# Patient Record
Sex: Female | Born: 1985
Health system: Southern US, Community
[De-identification: ages and names within clinical notes are randomized; demographics above are authoritative.]

## PROBLEM LIST (undated history)

## (undated) ENCOUNTER — Inpatient Hospital Stay (HOSPITAL_COMMUNITY): Payer: Self-pay

## (undated) DIAGNOSIS — O009 Unspecified ectopic pregnancy without intrauterine pregnancy: Secondary | ICD-10-CM

## (undated) HISTORY — DX: Unspecified ectopic pregnancy without intrauterine pregnancy: O00.90

## (undated) HISTORY — PX: LAPAROTOMY: SHX154

---
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
## 2012-11-15 NOTE — L&D Delivery Note (Signed)
Delivery Note At 7:13 PM a viable female was delivered via Vaginal, Spontaneous Delivery (Presentation: Left Occiput Anterior).  APGAR: 4, 9; weight 9 lb 1.4 oz (4122 g).   Placenta status: Intact, Spontaneous.  Cord: 3 vessels with the following complications: see below.  Cord pH: 7.21  Anesthesia: Epidural  Episiotomy: None Lacerations: Labial Suture Repair: 2.0 vicryl Est. Blood Loss (mL): 400  Mom to postpartum.  Baby to nursery-stable.  Pt pushed from c/c/+3 to deliver via NSVD a term viable female.  Pt with a very mild shoulder dystocia that lasted for <30 seconds and required McRoberts and suprapubic pressure alone to release the anterior shoulder.  Baby presented floppy without initial spontaneous cry. Cord was clamped and cut and he was taken to the warmer where NICU team soon arrived to evaluate him.  He aroused quickly. Cord pH was 7.21. Apgars of 4 and 9.  Placenta delivered spontaneously intact with traction and pitocin.  Small labial tear repaired with 2.0 vicryl with adequate hemostasis.  Baby and Mom did well without further complications.   Luc Shammas L 07/20/2013, 8:59 PM

## 2013-03-30 ENCOUNTER — Ambulatory Visit (INDEPENDENT_AMBULATORY_CARE_PROVIDER_SITE_OTHER): Payer: Self-pay | Admitting: Advanced Practice Midwife

## 2013-03-30 ENCOUNTER — Encounter: Payer: Self-pay | Admitting: Advanced Practice Midwife

## 2013-03-30 VITALS — BP 107/61 | Ht 65.0 in | Wt 160.0 lb

## 2013-03-30 DIAGNOSIS — O0932 Supervision of pregnancy with insufficient antenatal care, second trimester: Secondary | ICD-10-CM

## 2013-03-30 DIAGNOSIS — Z8742 Personal history of other diseases of the female genital tract: Secondary | ICD-10-CM

## 2013-03-30 DIAGNOSIS — O0930 Supervision of pregnancy with insufficient antenatal care, unspecified trimester: Secondary | ICD-10-CM

## 2013-03-30 DIAGNOSIS — Z8759 Personal history of other complications of pregnancy, childbirth and the puerperium: Secondary | ICD-10-CM

## 2013-03-30 LAB — OB RESULTS CONSOLE GC/CHLAMYDIA: Chlamydia: NEGATIVE

## 2013-03-30 NOTE — Progress Notes (Signed)
Last pap in March @ [facility]  p-84

## 2013-03-30 NOTE — Patient Instructions (Addendum)
Pregnancy - Second Trimester The second trimester of pregnancy (3 to 6 months) is a period of rapid growth for you and your baby. At the end of the sixth month, your baby is about 9 inches long and weighs 1 1/2 pounds. You will begin to feel the baby move between 18 and 20 weeks of the pregnancy. This is called quickening. Weight gain is faster. A clear fluid (colostrum) may leak out of your breasts. You may feel small contractions of the womb (uterus). This is known as false labor or Braxton-Hicks contractions. This is like a practice for labor when the baby is ready to be born. Usually, the problems with morning sickness have usually passed by the end of your first trimester. Some women develop small dark blotches (called cholasma, mask of pregnancy) on their face that usually goes away after the baby is born. Exposure to the sun makes the blotches worse. Acne may also develop in some pregnant women and pregnant women who have acne, may find that it goes away. PRENATAL EXAMS  Blood work may continue to be done during prenatal exams. These tests are done to check on your health and the probable health of your baby. Blood work is used to follow your blood levels (hemoglobin). Anemia (low hemoglobin) is common during pregnancy. Iron and vitamins are given to help prevent this. You will also be checked for diabetes between 24 and 28 weeks of the pregnancy. Some of the previous blood tests may be repeated.  The size of the uterus is measured during each visit. This is to make sure that the baby is continuing to grow properly according to the dates of the pregnancy.  Your blood pressure is checked every prenatal visit. This is to make sure you are not getting toxemia.  Your urine is checked to make sure you do not have an infection, diabetes or protein in the urine.  Your weight is checked often to make sure gains are happening at the suggested rate. This is to ensure that both you and your baby are growing  normally.  Sometimes, an ultrasound is performed to confirm the proper growth and development of the baby. This is a test which bounces harmless sound waves off the baby so your caregiver can more accurately determine due dates. Sometimes, a specialized test is done on the amniotic fluid surrounding the baby. This test is called an amniocentesis. The amniotic fluid is obtained by sticking a needle into the belly (abdomen). This is done to check the chromosomes in instances where there is a concern about possible genetic problems with the baby. It is also sometimes done near the end of pregnancy if an early delivery is required. In this case, it is done to help make sure the baby's lungs are mature enough for the baby to live outside of the womb. CHANGES OCCURING IN THE SECOND TRIMESTER OF PREGNANCY Your body goes through many changes during pregnancy. They vary from person to person. Talk to your caregiver about changes you notice that you are concerned about.  During the second trimester, you will likely have an increase in your appetite. It is normal to have cravings for certain foods. This varies from person to person and pregnancy to pregnancy.  Your lower abdomen will begin to bulge.  You may have to urinate more often because the uterus and baby are pressing on your bladder. It is also common to get more bladder infections during pregnancy (pain with urination). You can help this by   drinking lots of fluids and emptying your bladder before and after intercourse.  You may begin to get stretch marks on your hips, abdomen, and breasts. These are normal changes in the body during pregnancy. There are no exercises or medications to take that prevent this change.  You may begin to develop swollen and bulging veins (varicose veins) in your legs. Wearing support hose, elevating your feet for 15 minutes, 3 to 4 times a day and limiting salt in your diet helps lessen the problem.  Heartburn may develop  as the uterus grows and pushes up against the stomach. Antacids recommended by your caregiver helps with this problem. Also, eating smaller meals 4 to 5 times a day helps.  Constipation can be treated with a stool softener or adding bulk to your diet. Drinking lots of fluids, vegetables, fruits, and whole grains are helpful.  Exercising is also helpful. If you have been very active up until your pregnancy, most of these activities can be continued during your pregnancy. If you have been less active, it is helpful to start an exercise program such as walking.  Hemorrhoids (varicose veins in the rectum) may develop at the end of the second trimester. Warm sitz baths and hemorrhoid cream recommended by your caregiver helps hemorrhoid problems.  Backaches may develop during this time of your pregnancy. Avoid heavy lifting, wear low heal shoes and practice good posture to help with backache problems.  Some pregnant women develop tingling and numbness of their hand and fingers because of swelling and tightening of ligaments in the wrist (carpel tunnel syndrome). This goes away after the baby is born.  As your breasts enlarge, you may have to get a bigger bra. Get a comfortable, cotton, support bra. Do not get a nursing bra until the last month of the pregnancy if you will be nursing the baby.  You may get a dark line from your belly button to the pubic area called the linea nigra.  You may develop rosy cheeks because of increase blood flow to the face.  You may develop spider looking lines of the face, neck, arms and chest. These go away after the baby is born. HOME CARE INSTRUCTIONS   It is extremely important to avoid all smoking, herbs, alcohol, and unprescribed drugs during your pregnancy. These chemicals affect the formation and growth of the baby. Avoid these chemicals throughout the pregnancy to ensure the delivery of a healthy infant.  Most of your home care instructions are the same as  suggested for the first trimester of your pregnancy. Keep your caregiver's appointments. Follow your caregiver's instructions regarding medication use, exercise and diet.  During pregnancy, you are providing food for you and your baby. Continue to eat regular, well-balanced meals. Choose foods such as meat, fish, milk and other low fat dairy products, vegetables, fruits, and whole-grain breads and cereals. Your caregiver will tell you of the ideal weight gain.  A physical sexual relationship may be continued up until near the end of pregnancy if there are no other problems. Problems could include early (premature) leaking of amniotic fluid from the membranes, vaginal bleeding, abdominal pain, or other medical or pregnancy problems.  Exercise regularly if there are no restrictions. Check with your caregiver if you are unsure of the safety of some of your exercises. The greatest weight gain will occur in the last 2 trimesters of pregnancy. Exercise will help you:  Control your weight.  Get you in shape for labor and delivery.  Lose weight   after you have the baby.  Wear a good support or jogging bra for breast tenderness during pregnancy. This may help if worn during sleep. Pads or tissues may be used in the bra if you are leaking colostrum.  Do not use hot tubs, steam rooms or saunas throughout the pregnancy.  Wear your seat belt at all times when driving. This protects you and your baby if you are in an accident.  Avoid raw meat, uncooked cheese, cat litter boxes and soil used by cats. These carry germs that can cause birth defects in the baby.  The second trimester is also a good time to visit your dentist for your dental health if this has not been done yet. Getting your teeth cleaned is OK. Use a soft toothbrush. Brush gently during pregnancy.  It is easier to loose urine during pregnancy. Tightening up and strengthening the pelvic muscles will help with this problem. Practice stopping your  urination while you are going to the bathroom. These are the same muscles you need to strengthen. It is also the muscles you would use as if you were trying to stop from passing gas. You can practice tightening these muscles up 10 times a set and repeating this about 3 times per day. Once you know what muscles to tighten up, do not perform these exercises during urination. It is more likely to contribute to an infection by backing up the urine.  Ask for help if you have financial, counseling or nutritional needs during pregnancy. Your caregiver will be able to offer counseling for these needs as well as refer you for other special needs.  Your skin may become oily. If so, wash your face with mild soap, use non-greasy moisturizer and oil or cream based makeup. MEDICATIONS AND DRUG USE IN PREGNANCY  Take prenatal vitamins as directed. The vitamin should contain 1 milligram of folic acid. Keep all vitamins out of reach of children. Only a couple vitamins or tablets containing iron may be fatal to a baby or young child when ingested.  Avoid use of all medications, including herbs, over-the-counter medications, not prescribed or suggested by your caregiver. Only take over-the-counter or prescription medicines for pain, discomfort, or fever as directed by your caregiver. Do not use aspirin.  Let your caregiver also know about herbs you may be using.  Alcohol is related to a number of birth defects. This includes fetal alcohol syndrome. All alcohol, in any form, should be avoided completely. Smoking will cause low birth rate and premature babies.  Street or illegal drugs are very harmful to the baby. They are absolutely forbidden. A baby born to an addicted mother will be addicted at birth. The baby will go through the same withdrawal an adult does. SEEK MEDICAL CARE IF:  You have any concerns or worries during your pregnancy. It is better to call with your questions if you feel they cannot wait, rather  than worry about them. SEEK IMMEDIATE MEDICAL CARE IF:   An unexplained oral temperature above 102 F (38.9 C) develops, or as your caregiver suggests.  You have leaking of fluid from the vagina (birth canal). If leaking membranes are suspected, take your temperature and tell your caregiver of this when you call.  There is vaginal spotting, bleeding, or passing clots. Tell your caregiver of the amount and how many pads are used. Light spotting in pregnancy is common, especially following intercourse.  You develop a bad smelling vaginal discharge with a change in the color from clear   to white.  You continue to feel sick to your stomach (nauseated) and have no relief from remedies suggested. You vomit blood or coffee ground-like materials.  You lose more than 2 pounds of weight or gain more than 2 pounds of weight over 1 week, or as suggested by your caregiver.  You notice swelling of your face, hands, feet, or legs.  You get exposed to German measles and have never had them.  You are exposed to fifth disease or chickenpox.  You develop belly (abdominal) pain. Round ligament discomfort is a common non-cancerous (benign) cause of abdominal pain in pregnancy. Your caregiver still must evaluate you.  You develop a bad headache that does not go away.  You develop fever, diarrhea, pain with urination, or shortness of breath.  You develop visual problems, blurry, or double vision.  You fall or are in a car accident or any kind of trauma.  There is mental or physical violence at home. Document Released: 10/26/2001 Document Revised: 01/24/2012 Document Reviewed: 04/30/2009 Exit[employer] Patient Information 2013 [employer].  

## 2013-03-31 LAB — HIV ANTIBODY (ROUTINE TESTING W REFLEX): HIV: NONREACTIVE

## 2013-03-31 LAB — SICKLE CELL SCREEN: Sickle Cell Screen: NEGATIVE

## 2013-04-01 ENCOUNTER — Encounter: Payer: Self-pay | Admitting: Advanced Practice Midwife

## 2013-04-01 DIAGNOSIS — Z8759 Personal history of other complications of pregnancy, childbirth and the puerperium: Secondary | ICD-10-CM | POA: Insufficient documentation

## 2013-04-01 NOTE — Progress Notes (Signed)
See Smart Set note   Subjective:    Monique Hickman is a G2P0010 [redacted]w[redacted]d being seen today for her first obstetrical visit.  Her obstetrical history is significant for late prenatal care. Patient does intend to breast feed. Pregnancy history fully reviewed.  Patient reports no complaints.  Filed Vitals:   03/30/13 1022 03/30/13 1050  BP: 107/61   Height:  5\' 5"  (1.651 m)  Weight: 160 lb (72.576 kg)     HISTORY: OB History   Grav Para Term Preterm Abortions TAB SAB Ect Mult Living   2 0   1   1  0     # Outc Date GA Lbr Len/2nd Wgt Sex Del Anes PTL Lv   1 ECT 5/03           2 CUR              Past Medical History  Diagnosis Date  . Ectopic pregnancy 2003    Lapt     Past Surgical History  Procedure Laterality Date  . Laparotomy      Removal of tube   History reviewed. No pertinent family history.   Exam    Uterus:  Fundal Height: 24 cm  Pelvic Exam:    Perineum: No Hemorrhoids   Vulva: Deferred today   Vagina:  normal discharge   pH:    Cervix: deferred today   Adnexa: not evaluated   Bony Pelvis: Unproven  System: Breast:  normal appearance, no masses or tenderness   Skin: normal coloration and turgor, no rashes    Neurologic: oriented, grossly non-focal   Extremities: normal strength, tone, and muscle mass   HEENT trachea midline   Mouth/Teeth mucous membranes moist, pharynx normal without lesions   Neck supple and no masses   Cardiovascular: regular rate and rhythm, no murmurs or gallops   Respiratory:  appears well, vitals normal, no respiratory distress, acyanotic, normal RR, ear and throat exam is normal, neck free of mass or lymphadenopathy, chest clear, no wheezing, crepitations, rhonchi, normal symmetric air entry   Abdomen: soft, non-tender; bowel sounds normal; no masses,  no organomegaly   Urinary: urethral meatus normal   Pap deferred today, pt state just had one    Assessment:    Pregnancy: G2P0010 Patient Active Problem List   Diagnosis  Date Noted  . History of ectopic pregnancy 04/01/2013        Plan:     Initial labs drawn. Prenatal vitamins. Problem list reviewed and updated. Genetic Screening discussed First Screen: Too Late.  Ultrasound discussed; fetal survey: ordered.  Follow up in 4 weeks. 50% of 30 min visit spent on counseling and coordination of care.     [facility] 04/01/2013

## 2013-04-02 LAB — OBSTETRIC PANEL
Antibody Screen: NEGATIVE
Basophils Absolute: 0 10*3/uL (ref 0.0–0.1)
Basophils Relative: 0 % (ref 0–1)
Eosinophils Absolute: 0.1 10*3/uL (ref 0.0–0.7)
Eosinophils Relative: 2 % (ref 0–5)
MCH: 30.1 pg (ref 26.0–34.0)
MCHC: 34.4 g/dL (ref 30.0–36.0)
MCV: 87.5 fL (ref 78.0–100.0)
Platelets: 171 10*3/uL (ref 150–400)
RDW: 15.3 % (ref 11.5–15.5)
WBC: 7.9 10*3/uL (ref 4.0–10.5)

## 2013-04-02 LAB — GC/CHLAMYDIA PROBE AMP: GC Probe RNA: NEGATIVE

## 2013-04-12 ENCOUNTER — Ambulatory Visit (HOSPITAL_COMMUNITY)
Admission: RE | Admit: 2013-04-12 | Discharge: 2013-04-12 | Disposition: A | Discharge status: Home / Self Care | Payer: Medicaid Other | Source: Ambulatory Visit | Attending: Advanced Practice Midwife | Admitting: Advanced Practice Midwife

## 2013-04-12 DIAGNOSIS — O0930 Supervision of pregnancy with insufficient antenatal care, unspecified trimester: Secondary | ICD-10-CM | POA: Insufficient documentation

## 2013-04-12 DIAGNOSIS — O0932 Supervision of pregnancy with insufficient antenatal care, second trimester: Secondary | ICD-10-CM

## 2013-04-12 DIAGNOSIS — Z3689 Encounter for other specified antenatal screening: Secondary | ICD-10-CM | POA: Insufficient documentation

## 2013-04-20 ENCOUNTER — Ambulatory Visit (INDEPENDENT_AMBULATORY_CARE_PROVIDER_SITE_OTHER): Payer: Medicaid Other | Admitting: Advanced Practice Midwife

## 2013-04-20 ENCOUNTER — Encounter: Payer: Self-pay | Admitting: Advanced Practice Midwife

## 2013-04-20 VITALS — BP 114/63 | Wt 165.0 lb

## 2013-04-20 DIAGNOSIS — Z3482 Encounter for supervision of other normal pregnancy, second trimester: Secondary | ICD-10-CM

## 2013-04-20 DIAGNOSIS — Z3480 Encounter for supervision of other normal pregnancy, unspecified trimester: Secondary | ICD-10-CM

## 2013-04-20 LAB — CBC
Hemoglobin: 11.9 g/dL — ABNORMAL LOW (ref 12.0–15.0)
MCH: 29.6 pg (ref 26.0–34.0)
Platelets: 165 10*3/uL (ref 150–400)
RBC: 4.02 MIL/uL (ref 3.87–5.11)
WBC: 8.3 10*3/uL (ref 4.0–10.5)

## 2013-04-20 NOTE — Progress Notes (Signed)
Feels well. Occasional back pain. No contractions. Glucola today

## 2013-04-20 NOTE — Progress Notes (Signed)
28 week labs  p-92

## 2013-04-20 NOTE — Patient Instructions (Signed)

## 2013-04-21 LAB — RPR

## 2013-04-21 LAB — GLUCOSE TOLERANCE, 1 HOUR (50G) W/O FASTING: Glucose, 1 Hour GTT: 104 mg/dL (ref 70–140)

## 2013-04-21 LAB — HIV ANTIBODY (ROUTINE TESTING W REFLEX): HIV: NONREACTIVE

## 2013-05-07 ENCOUNTER — Encounter: Payer: Self-pay | Admitting: Advanced Practice Midwife

## 2013-05-07 ENCOUNTER — Ambulatory Visit (INDEPENDENT_AMBULATORY_CARE_PROVIDER_SITE_OTHER): Payer: Medicaid Other | Admitting: Advanced Practice Midwife

## 2013-05-07 VITALS — BP 112/63 | Wt 167.0 lb

## 2013-05-07 DIAGNOSIS — O0930 Supervision of pregnancy with insufficient antenatal care, unspecified trimester: Secondary | ICD-10-CM | POA: Insufficient documentation

## 2013-05-07 DIAGNOSIS — O0932 Supervision of pregnancy with insufficient antenatal care, second trimester: Secondary | ICD-10-CM

## 2013-05-07 NOTE — Patient Instructions (Addendum)

## 2013-05-07 NOTE — Progress Notes (Signed)
Doing well. Has some round ligament pains and some nagging pain on right side (has been coughing this week). Discussed probably loosened cartilage between ribs. No further respiratory symptoms. Informed glucola WNL

## 2013-05-07 NOTE — Progress Notes (Signed)
p-85 

## 2013-05-22 ENCOUNTER — Encounter: Payer: Medicaid Other | Admitting: Obstetrics & Gynecology

## 2013-05-28 ENCOUNTER — Ambulatory Visit (INDEPENDENT_AMBULATORY_CARE_PROVIDER_SITE_OTHER): Payer: Medicaid Other | Admitting: Advanced Practice Midwife

## 2013-05-28 VITALS — BP 115/58 | Wt 171.0 lb

## 2013-05-28 DIAGNOSIS — O0930 Supervision of pregnancy with insufficient antenatal care, unspecified trimester: Secondary | ICD-10-CM

## 2013-05-28 NOTE — Progress Notes (Signed)
Doing well.  Good fetal movement, denies vaginal bleeding, LOF, regular contractions.  PTL precautions given.  Discussed rest, heat, increased PO fluids, position changes for round ligament pain.

## 2013-05-28 NOTE — Progress Notes (Signed)
P = 91 

## 2013-06-06 ENCOUNTER — Encounter (HOSPITAL_COMMUNITY): Payer: Self-pay | Admitting: *Deleted

## 2013-06-06 ENCOUNTER — Inpatient Hospital Stay (HOSPITAL_COMMUNITY)
Admission: AD | Admit: 2013-06-06 | Discharge: 2013-06-06 | Disposition: A | Discharge status: Home / Self Care | Payer: Medicaid Other | Source: Ambulatory Visit | Attending: Obstetrics & Gynecology | Admitting: Obstetrics & Gynecology

## 2013-06-06 ENCOUNTER — Telehealth: Payer: Self-pay | Admitting: *Deleted

## 2013-06-06 DIAGNOSIS — N949 Unspecified condition associated with female genital organs and menstrual cycle: Secondary | ICD-10-CM

## 2013-06-06 DIAGNOSIS — R1032 Left lower quadrant pain: Secondary | ICD-10-CM | POA: Insufficient documentation

## 2013-06-06 DIAGNOSIS — O99891 Other specified diseases and conditions complicating pregnancy: Secondary | ICD-10-CM | POA: Insufficient documentation

## 2013-06-06 DIAGNOSIS — IMO0001 Reserved for inherently not codable concepts without codable children: Secondary | ICD-10-CM | POA: Insufficient documentation

## 2013-06-06 DIAGNOSIS — R1084 Generalized abdominal pain: Secondary | ICD-10-CM

## 2013-06-06 DIAGNOSIS — M545 Low back pain: Secondary | ICD-10-CM

## 2013-06-06 DIAGNOSIS — O479 False labor, unspecified: Secondary | ICD-10-CM

## 2013-06-06 DIAGNOSIS — M7918 Myalgia, other site: Secondary | ICD-10-CM

## 2013-06-06 LAB — URINALYSIS, ROUTINE W REFLEX MICROSCOPIC
Ketones, ur: NEGATIVE mg/dL
Leukocytes, UA: NEGATIVE
Nitrite: NEGATIVE
Protein, ur: NEGATIVE mg/dL
Urobilinogen, UA: 0.2 mg/dL (ref 0.0–1.0)

## 2013-06-06 MED ORDER — CYCLOBENZAPRINE HCL 10 MG PO TABS: 10.0000 mg | ORAL_TABLET | Freq: Three times a day (TID) | ORAL | Status: DC | PRN

## 2013-06-06 NOTE — MAU Note (Signed)
Patient states she started having pain that starts on the left side and radiates all over the low back to the right side. Some abdominal pain off an on. Denies bleeding or leaking. Reports good fetal movement.

## 2013-06-06 NOTE — MAU Provider Note (Signed)
Chief Complaint:  Back Pain   None     HPI: Monique Hickman is a 27 y.o. G2P0010 pt of [facility] at [redacted]w[redacted]d who presents to maternity admissions reporting pain in lower left abdomen radiating to her back.  The pain is intermittent and severe enough she left work early today.  She reports good fetal movement, denies LOF, vaginal bleeding, vaginal itching/burning, urinary symptoms, h/a, dizziness, n/v, or fever/chills.     Past Medical History: Past Medical History  Diagnosis Date  . Ectopic pregnancy 2003    Lapt      Past obstetric history: OB History   Grav Para Term Preterm Abortions TAB SAB Ect Mult Living   2 0   1   1  0     # Outc Date GA Lbr Len/2nd Wgt Sex Del Anes PTL Lv   1 ECT 5/03           2 CUR               Past Surgical History: Past Surgical History  Procedure Laterality Date  . Laparotomy      Removal of tube    Family History: History reviewed. No pertinent family history.  Social History: History  Substance Use Topics  . Smoking status: Never Smoker   . Smokeless tobacco: Never Used  . Alcohol Use: No    Allergies: No Known Allergies  Meds:  Prescriptions prior to admission  Medication Sig Dispense Refill  . Prenatal Vit-Fe Fumarate-FA (PRENATAL MULTIVITAMIN) TABS Take 1 tablet by mouth daily at 12 noon.        ROS: Pertinent findings in history of present illness.  Physical Exam  Blood pressure 115/74, pulse 99, temperature 97.9 F (36.6 C), temperature source Oral, resp. rate 16, height 5\' 5"  (1.651 m), weight 77.928 kg (171 lb 12.8 oz), last menstrual period 10/11/2012, SpO2 100.00%. GENERAL: Well-developed, well-nourished female in no acute distress.  HEENT: normocephalic HEART: normal rate RESP: normal effort ABDOMEN: Soft, non-tender, gravid appropriate for gestational age Negative CVA tenderness, tenderness to palpation in left inguinal area and left lower back EXTREMITIES: Nontender, no edema NEURO: alert and  oriented  Dilation: 1 Effacement (%): Thick Cervical Position: Anterior Presentation: Undeterminable Exam by:: Ginger Morris RN  FHT:  Baseline 145 , moderate variability, accelerations present, no decelerations Contractions: q 10 min, irregular, mild to palpation   Labs: Results for orders placed during the hospital encounter of 06/06/13 (from the past 24 hour(s))  URINALYSIS, ROUTINE W REFLEX MICROSCOPIC     Status: None   Collection Time    06/06/13  4:46 PM      Result Value Range   Color, Urine YELLOW  YELLOW   APPearance CLEAR  CLEAR   Specific Gravity, Urine 1.015  1.005 - 1.030   pH 7.5  5.0 - 8.0   Glucose, UA NEGATIVE  NEGATIVE mg/dL   Hgb urine dipstick NEGATIVE  NEGATIVE   Bilirubin Urine NEGATIVE  NEGATIVE   Ketones, ur NEGATIVE  NEGATIVE mg/dL   Protein, ur NEGATIVE  NEGATIVE mg/dL   Urobilinogen, UA 0.2  0.0 - 1.0 mg/dL   Nitrite NEGATIVE  NEGATIVE   Leukocytes, UA NEGATIVE  NEGATIVE   Assessment: 1. Round ligament pain   2. Musculoskeletal pain     Plan: Discharge home PTL precautions and fetal kick counts Flexeril 10 mg PO TID PRN Rest, ice, heat, warm bath, Tylenol for pain Recommend pregnancy support belt for pt F/U at [facility] Return to [place] as  needed  Follow-up Information   Follow up with [facility] [place]. (Keep scheduled appointments.  Return to MAU as needed.)    Contact information:   [address], [address] [place] [address] [phone]       Medication List         cyclobenzaprine 10 MG tablet  Commonly known as:  FLEXERIL  Take 1 tablet (10 mg total) by mouth 3 (three) times daily as needed for muscle spasms.     prenatal multivitamin Tabs  Take 1 tablet by mouth daily at 12 noon.        Sharen Counter Certified Nurse-Midwife 06/06/2013 7:12 PM

## 2013-06-06 NOTE — Telephone Encounter (Signed)
Pt called in adv having 8/10 pain in left side of abd. Pt describes pain as "dragging" and radiating all the way around. I adv pt to report to MAU for eval

## 2013-06-11 ENCOUNTER — Ambulatory Visit (INDEPENDENT_AMBULATORY_CARE_PROVIDER_SITE_OTHER): Payer: Medicaid Other | Admitting: Advanced Practice Midwife

## 2013-06-11 ENCOUNTER — Encounter: Payer: Self-pay | Admitting: Advanced Practice Midwife

## 2013-06-11 VITALS — BP 116/76 | Wt 173.0 lb

## 2013-06-11 DIAGNOSIS — O0932 Supervision of pregnancy with insufficient antenatal care, second trimester: Secondary | ICD-10-CM

## 2013-06-11 DIAGNOSIS — O0930 Supervision of pregnancy with insufficient antenatal care, unspecified trimester: Secondary | ICD-10-CM

## 2013-06-11 NOTE — Progress Notes (Signed)
Doing well. Back feels much better.

## 2013-06-11 NOTE — Progress Notes (Signed)
p-109 

## 2013-06-11 NOTE — Patient Instructions (Signed)

## 2013-06-25 ENCOUNTER — Ambulatory Visit (INDEPENDENT_AMBULATORY_CARE_PROVIDER_SITE_OTHER): Payer: Medicaid Other | Admitting: Advanced Practice Midwife

## 2013-06-25 VITALS — BP 127/73 | Wt 174.0 lb

## 2013-06-25 DIAGNOSIS — Z3403 Encounter for supervision of normal first pregnancy, third trimester: Secondary | ICD-10-CM

## 2013-06-25 DIAGNOSIS — Z3400 Encounter for supervision of normal first pregnancy, unspecified trimester: Secondary | ICD-10-CM

## 2013-06-25 NOTE — Progress Notes (Signed)
Doing well.  Good fetal movement, denies vaginal bleeding, LOF, regular contractions.  Reviewed signs of labor, reasons to return to hospital.

## 2013-06-25 NOTE — Progress Notes (Signed)
p-87 

## 2013-07-02 ENCOUNTER — Ambulatory Visit (INDEPENDENT_AMBULATORY_CARE_PROVIDER_SITE_OTHER): Payer: Medicaid Other | Admitting: Advanced Practice Midwife

## 2013-07-02 VITALS — BP 127/77 | Wt 177.0 lb

## 2013-07-02 DIAGNOSIS — O0930 Supervision of pregnancy with insufficient antenatal care, unspecified trimester: Secondary | ICD-10-CM

## 2013-07-02 DIAGNOSIS — O0933 Supervision of pregnancy with insufficient antenatal care, third trimester: Secondary | ICD-10-CM

## 2013-07-02 NOTE — Patient Instructions (Signed)
Website with pregnancy info: [email or web address]

## 2013-07-02 NOTE — Progress Notes (Signed)
Doing well.  Good fetal movement, denies vaginal bleeding, LOF, regular contractions. Pt fearful of labor.  Discussed normal labor, encouraged pt to try to relax, visualize normal labor.  Pt does not have a ride to hospital.  Encouraged her to make a plan for who will bring her if she is at home, or at work. Reviewed signs of labor/reasons to come to hospital.

## 2013-07-02 NOTE — Progress Notes (Signed)
P = 107 

## 2013-07-09 ENCOUNTER — Ambulatory Visit (INDEPENDENT_AMBULATORY_CARE_PROVIDER_SITE_OTHER): Payer: Medicaid Other | Admitting: Advanced Practice Midwife

## 2013-07-09 VITALS — BP 114/65 | Wt 177.0 lb

## 2013-07-09 DIAGNOSIS — O0930 Supervision of pregnancy with insufficient antenatal care, unspecified trimester: Secondary | ICD-10-CM

## 2013-07-09 NOTE — Progress Notes (Signed)
P- 101-Pt has a lot of leg pain during the night

## 2013-07-09 NOTE — Progress Notes (Signed)
Doing well.  Good fetal movement, denies vaginal bleeding, LOF, regular contractions.  Desires cervical exam. Reviewed signs of labor.  

## 2013-07-17 ENCOUNTER — Encounter: Payer: Medicaid Other | Admitting: Obstetrics & Gynecology

## 2013-07-18 ENCOUNTER — Encounter (HOSPITAL_COMMUNITY): Payer: Self-pay | Admitting: *Deleted

## 2013-07-18 ENCOUNTER — Telehealth (HOSPITAL_COMMUNITY): Payer: Self-pay | Admitting: *Deleted

## 2013-07-18 ENCOUNTER — Ambulatory Visit (INDEPENDENT_AMBULATORY_CARE_PROVIDER_SITE_OTHER): Payer: Medicaid Other | Admitting: Obstetrics & Gynecology

## 2013-07-18 VITALS — BP 118/75 | Wt 178.0 lb

## 2013-07-18 DIAGNOSIS — Z23 Encounter for immunization: Secondary | ICD-10-CM

## 2013-07-18 DIAGNOSIS — O0933 Supervision of pregnancy with insufficient antenatal care, third trimester: Secondary | ICD-10-CM

## 2013-07-18 DIAGNOSIS — O0930 Supervision of pregnancy with insufficient antenatal care, unspecified trimester: Secondary | ICD-10-CM

## 2013-07-18 MED ORDER — TETANUS-DIPHTH-ACELL PERTUSSIS 5-2.5-18.5 LF-MCG/0.5 IM SUSP
0.5000 mL | Freq: Once | INTRAMUSCULAR | Status: AC
  Administered 2013-07-18: 0.5 mL via INTRAMUSCULAR

## 2013-07-18 NOTE — Progress Notes (Signed)
NST today and Monday and induce at 41 weeks.  Tdap offered today.   NO issues.  GBS negative.

## 2013-07-18 NOTE — Progress Notes (Signed)
P = 91 

## 2013-07-18 NOTE — Telephone Encounter (Signed)
Preadmission screen  

## 2013-07-18 NOTE — Addendum Note (Signed)
Addended by: Arne Cleveland on: 07/18/2013 10:55 AM   Modules accepted: Orders

## 2013-07-20 ENCOUNTER — Inpatient Hospital Stay (HOSPITAL_COMMUNITY)
Admission: AD | Admit: 2013-07-20 | Discharge: 2013-07-22 | DRG: 775 | Disposition: A | Discharge status: Home / Self Care | Payer: Medicaid Other | Source: Ambulatory Visit | Attending: Obstetrics & Gynecology | Admitting: Obstetrics & Gynecology

## 2013-07-20 ENCOUNTER — Inpatient Hospital Stay (HOSPITAL_COMMUNITY): Payer: Medicaid Other | Admitting: Anesthesiology

## 2013-07-20 ENCOUNTER — Encounter (HOSPITAL_COMMUNITY): Payer: Self-pay | Admitting: *Deleted

## 2013-07-20 ENCOUNTER — Encounter (HOSPITAL_COMMUNITY): Payer: Self-pay | Admitting: Anesthesiology

## 2013-07-20 DIAGNOSIS — O4290 Premature rupture of membranes, unspecified as to length of time between rupture and onset of labor, unspecified weeks of gestation: Secondary | ICD-10-CM | POA: Diagnosis present

## 2013-07-20 DIAGNOSIS — Z8759 Personal history of other complications of pregnancy, childbirth and the puerperium: Secondary | ICD-10-CM

## 2013-07-20 LAB — CBC
HCT: 37.2 % (ref 36.0–46.0)
Hemoglobin: 12.9 g/dL (ref 12.0–15.0)
MCH: 30.1 pg (ref 26.0–34.0)
MCV: 87 fL (ref 78.0–100.0)
Platelets: 138 10*3/uL — ABNORMAL LOW (ref 150–400)
RDW: 13.5 % (ref 11.5–15.5)
WBC: 15.1 10*3/uL — ABNORMAL HIGH (ref 4.0–10.5)

## 2013-07-20 MED ORDER — PHENYLEPHRINE 40 MCG/ML (10ML) SYRINGE FOR IV PUSH (FOR BLOOD PRESSURE SUPPORT)
80.0000 ug | PREFILLED_SYRINGE | INTRAVENOUS | Status: DC | PRN
  Filled 2013-07-20: qty 5
  Filled 2013-07-20: qty 2

## 2013-07-20 MED ORDER — OXYTOCIN BOLUS FROM INFUSION
500.0000 mL | INTRAVENOUS | Status: DC
  Administered 2013-07-20: 500 mL via INTRAVENOUS

## 2013-07-20 MED ORDER — LIDOCAINE HCL (PF) 1 % IJ SOLN
30.0000 mL | INTRAMUSCULAR | Status: DC | PRN
  Filled 2013-07-20 (×2): qty 30

## 2013-07-20 MED ORDER — PHENYLEPHRINE 40 MCG/ML (10ML) SYRINGE FOR IV PUSH (FOR BLOOD PRESSURE SUPPORT)
80.0000 ug | PREFILLED_SYRINGE | INTRAVENOUS | Status: DC | PRN
  Filled 2013-07-20: qty 2

## 2013-07-20 MED ORDER — LACTATED RINGERS IV SOLN
500.0000 mL | INTRAVENOUS | Status: DC | PRN
  Administered 2013-07-20: 1000 mL via INTRAVENOUS

## 2013-07-20 MED ORDER — FENTANYL 2.5 MCG/ML BUPIVACAINE 1/10 % EPIDURAL INFUSION (WH - ANES)
INTRAMUSCULAR | Status: DC | PRN
  Administered 2013-07-20: 14 mL/h via EPIDURAL

## 2013-07-20 MED ORDER — MISOPROSTOL 50MCG HALF TABLET
50.0000 ug | ORAL_TABLET | ORAL | Status: DC
  Administered 2013-07-20: 50 ug via ORAL
  Filled 2013-07-20 (×9): qty 1

## 2013-07-20 MED ORDER — FENTANYL CITRATE 0.05 MG/ML IJ SOLN
50.0000 ug | INTRAMUSCULAR | Status: DC | PRN
  Administered 2013-07-20 (×3): 50 ug via INTRAVENOUS
  Filled 2013-07-20 (×3): qty 2

## 2013-07-20 MED ORDER — LACTATED RINGERS IV SOLN: 500.0000 mL | Freq: Once | INTRAVENOUS | Status: DC

## 2013-07-20 MED ORDER — FENTANYL 2.5 MCG/ML BUPIVACAINE 1/10 % EPIDURAL INFUSION (WH - ANES)
14.0000 mL/h | INTRAMUSCULAR | Status: DC | PRN
  Administered 2013-07-20: 14 mL/h via EPIDURAL
  Filled 2013-07-20 (×2): qty 125

## 2013-07-20 MED ORDER — LIDOCAINE HCL (PF) 1 % IJ SOLN
INTRAMUSCULAR | Status: DC | PRN
  Administered 2013-07-20 (×2): 4 mL

## 2013-07-20 MED ORDER — DIPHENHYDRAMINE HCL 50 MG/ML IJ SOLN: 12.5000 mg | INTRAMUSCULAR | Status: DC | PRN

## 2013-07-20 MED ORDER — EPHEDRINE 5 MG/ML INJ
10.0000 mg | INTRAVENOUS | Status: DC | PRN
  Filled 2013-07-20: qty 2

## 2013-07-20 MED ORDER — OXYTOCIN 40 UNITS IN LACTATED RINGERS INFUSION - SIMPLE MED
62.5000 mL/h | INTRAVENOUS | Status: DC
  Filled 2013-07-20: qty 1000

## 2013-07-20 MED ORDER — ACETAMINOPHEN 325 MG PO TABS: 650.0000 mg | ORAL_TABLET | ORAL | Status: DC | PRN

## 2013-07-20 MED ORDER — LACTATED RINGERS IV SOLN
INTRAVENOUS | Status: DC
  Administered 2013-07-20 (×2): via INTRAVENOUS

## 2013-07-20 MED ORDER — CITRIC ACID-SODIUM CITRATE 334-500 MG/5ML PO SOLN: 30.0000 mL | ORAL | Status: DC | PRN

## 2013-07-20 MED ORDER — FLEET ENEMA 7-19 GM/118ML RE ENEM: 1.0000 | ENEMA | RECTAL | Status: DC | PRN

## 2013-07-20 MED ORDER — OXYCODONE-ACETAMINOPHEN 5-325 MG PO TABS: 1.0000 | ORAL_TABLET | ORAL | Status: DC | PRN

## 2013-07-20 MED ORDER — EPHEDRINE 5 MG/ML INJ
10.0000 mg | INTRAVENOUS | Status: DC | PRN
  Filled 2013-07-20: qty 4
  Filled 2013-07-20: qty 2

## 2013-07-20 MED ORDER — ONDANSETRON HCL 4 MG/2ML IJ SOLN
4.0000 mg | Freq: Four times a day (QID) | INTRAMUSCULAR | Status: DC | PRN
  Administered 2013-07-20: 4 mg via INTRAVENOUS
  Filled 2013-07-20: qty 2

## 2013-07-20 MED ORDER — IBUPROFEN 600 MG PO TABS
600.0000 mg | ORAL_TABLET | Freq: Four times a day (QID) | ORAL | Status: DC | PRN
  Filled 2013-07-20: qty 1

## 2013-07-20 NOTE — Anesthesia Procedure Notes (Signed)
Epidural Patient location during procedure: OB Start time: 07/20/2013 10:36 AM  Staffing Anesthesiologist: Clarise Chacko A. Performed by: anesthesiologist   Preanesthetic Checklist Completed: patient identified, site marked, surgical consent, pre-op evaluation, timeout performed, IV checked, risks and benefits discussed and monitors and equipment checked  Epidural Patient position: sitting Prep: site prepped and draped and DuraPrep Patient monitoring: continuous pulse ox and blood pressure Approach: midline Injection technique: LOR air  Needle:  Needle type: Tuohy  Needle gauge: 17 G Needle length: 9 cm and 9 Needle insertion depth: 5 cm cm Catheter type: closed end flexible Catheter size: 19 Gauge Catheter at skin depth: 10 cm Test dose: negative and Other  Assessment Events: blood not aspirated, injection not painful, no injection resistance, negative IV test and no paresthesia  Additional Notes Patient identified. Risks and benefits discussed including failed block, incomplete  Pain control, post dural puncture headache, nerve damage, paralysis, blood pressure Changes, nausea, vomiting, reactions to medications-both toxic and allergic and post Partum back pain. All questions were answered. Patient expressed understanding and wished to proceed. Sterile technique was used throughout procedure. Epidural site was Dressed with sterile barrier dressing. No paresthesias, signs of intravascular injection Or signs of intrathecal spread were encountered.  Patient was more comfortable after the epidural was dosed. Please see RN's note for documentation of vital signs and FHR which are stable.

## 2013-07-20 NOTE — MAU Note (Signed)
PT SAYS SROM AT 0100-  CLEAR FLUID.   GETS PNC AT [place]-.       VE IN   WED 1 CM.    DENIES HSV AND MRSA.

## 2013-07-20 NOTE — Progress Notes (Signed)
Monique Hickman is a 27 y.o. G2P0010 at [redacted]w[redacted]d  admitted for PROM  Subjective:  Pt doing well. Comfortable with epidural. +FM  Objective: BP 127/59  Pulse 94  Temp(Src) 98.6 F (37 C) (Oral)  Resp 18  Ht 5\' 5"  (1.651 m)  Wt 80.74 kg (178 lb)  BMI 29.62 kg/m2  SpO2 100%  LMP 10/11/2012      FHT:  FHR: 140 bpm, variability: moderate,  accelerations:  Present,  decelerations:  Absent UC:   regular, every 5-6 minutes SVE:   Dilation: 2 Effacement (%): 90 Station: -2 Exam by:: dherr rn  Labs: Lab Results  Component Value Date   WBC 7.1 07/20/2013   HGB 13.4 07/20/2013   HCT 38.7 07/20/2013   MCV 87.0 07/20/2013   PLT 138* 07/20/2013    Assessment / Plan: PROM s/p cytotec. now progressing on her own  Labor: Progressing normally Fetal Wellbeing:  Category I Pain Control:  Epidural I/D:  n/a Anticipated MOD:  NSVD  Zayvier Caravello L 07/20/2013, 12:57 PM

## 2013-07-20 NOTE — Progress Notes (Signed)
MD notified that RN is waiting for repeat CBC and plastic cap came of epidural tubing. MD states to wrap tubing end in alcohol wipe.

## 2013-07-20 NOTE — H&P (Signed)
Monique Hickman is a 27 y.o. female presenting for SROM. This morning around 1am, she noticed multiple episodes gushing clear fluid. She has not been feeling contractions. She reports good fetal movement.  Maternal Medical History:  Reason for admission: Nausea.    OB History   Grav Para Term Preterm Abortions TAB SAB Ect Mult Living   2 0   1   1  0     Past Medical History  Diagnosis Date  . Ectopic pregnancy 2003    Lapt     Past Surgical History  Procedure Laterality Date  . Laparotomy      Removal of tube   Family History: family history is not on file. Social History:  reports that she has never smoked. She has never used smokeless tobacco. She reports that she does not drink alcohol or use illicit drugs.   Prenatal Transfer Tool  Maternal Diabetes: No Genetic Screening: Too late to [facility] Maternal Ultrasounds/Referrals: Normal Fetal Ultrasounds or other Referrals:  None Maternal Substance Abuse:  No Significant Maternal Medications:  None Significant Maternal Lab Results:  None Other Comments:  Late prenatal care  Review of Systems  Constitutional: Negative for fever and chills.  Eyes: Negative for blurred vision.  Gastrointestinal: Negative for nausea and vomiting.  Genitourinary: Negative for dysuria.  Neurological: Negative for seizures and headaches.   Cervical exam 07/18/2013):  1/50/-3  FHT: 135bpm, moderate varibaility, no accels, 1 late decel and 1 early decel; Category II tracing UC: irregular every 5-6 minutes   Blood pressure 128/65, pulse 103, temperature 99.1 F (37.3 C), temperature source Oral, resp. rate 18, height 5\' 5"  (1.651 m), weight 80.74 kg (178 lb), last menstrual period 10/11/2012, SpO2 100.00%. Exam Physical Exam  Constitutional: She is oriented to person, place, and time. She appears well-developed. No distress.  Cardiovascular: Regular rhythm, S1 normal, S2 normal and intact distal pulses.  Tachycardia present.   No murmur  heard. Respiratory: Effort normal and breath sounds normal. No respiratory distress. She has no wheezes.  GI: There is no tenderness. There is no rebound and no guarding.  Musculoskeletal: Normal range of motion. She exhibits no edema and no tenderness.  Neurological: She is alert and oriented to person, place, and time. She has normal reflexes.  Skin: Skin is warm and dry. She is not diaphoretic.    Prenatal labs: ABO, Rh: O/POS/-- (05/16 1059) Antibody: NEG (05/16 1059) Rubella: 11.80 (05/16 1059) RPR: NON REAC (06/06 1003)  HBsAg: NEGATIVE (05/16 1059)  HIV: NON REACTIVE (06/06 1003)  GBS: Negative (08/11 0000)  1 hour glucose: 104 (Negative)  Assessment/Plan: 27yo G2P0010 at [redacted]w[redacted]d by LMP confirmed by 26w ultrasound, admitted for SROM, currently not feeling contractions  #SROM - admit to birthing suite - admitting attending physician is Dr. Debroah Loop - routine labor orders - pain management - expectant management  #Postpartum - breast/bottlefeed - circumcision outpatient - undecided method of contraception   [name], MD 07/20/2013, 3:38 AM    I have seen and examined this patient and agree the above assessment. CRESENZO-DISHMAN,Ramesses Crampton 07/20/2013 4:20 AM

## 2013-07-20 NOTE — Consult Note (Signed)
Neonatology Note:  Attendance at Code Apgar:   Our team responded to a Code Apgar call to room # 166 following NSVD, due to infant with apnea. The requesting physician was Dr. Reola Calkins. The mother is a G2P0A1 O pos, GBS neg who received PNC in [place], coming to the [place] shortly prior to delivery. The delivery was complicated by shoulder dystocia. ROM occurred 18 hours PTD and the fluid was clear.  At delivery, the baby was floppy, blue, and apneic. The OB nursing staff in attendance gave vigorous stimulation and a Code Apgar was called. Our team arrived just after 1 minute of life, at which time the baby was starting to cry, HR god, tone slightly decreased. We did bulb suctioning and the baby was quite vigorous by 1.5 minutes. Ap 4/9. The baby appears well, without resp distress, and with no focal deficits noted. I spoke with the mother in the DR, then transferred the baby to the Pediatrician's care.   Doretha Sou, MD

## 2013-07-20 NOTE — MAU Note (Signed)
Pt reports ROM at 0100, some contractions.  

## 2013-07-20 NOTE — Progress Notes (Signed)
   Krisann Mckenna is a 27 y.o. G2P0010 at [redacted]w[redacted]d  admitted for PROM  Subjective: Feeling a little crampy   Objective: BP 128/65  Pulse 103  Temp(Src) 98.9 F (37.2 C) (Oral)  Resp 18  Ht 5\' 5"  (1.651 m)  Wt 80.74 kg (178 lb)  BMI 29.62 kg/m2  SpO2 100%  LMP 10/11/2012    FHT:  FHR: 140 bpm, variability: moderate,  accelerations:  Present,  decelerations:  Absent UC:   irregular, every 2-5 minutes SVE:   Dilation: 1 Effacement (%): Thick Station: -3 Exam by:: fran cnm  Labs: Lab Results  Component Value Date   WBC 7.1 07/20/2013   HGB 13.4 07/20/2013   HCT 38.7 07/20/2013   MCV 87.0 07/20/2013   PLT 138* 07/20/2013    Assessment / Plan: PROM at term DIscussed IOL vs expectant management.  Chooses cytotec Labor: no Fetal Wellbeing:  Category I Pain Control:  Labor support without medications Anticipated MOD:  NSVD  CRESENZO-DISHMAN,Kaylob Wallen 07/20/2013, 4:20 AM

## 2013-07-20 NOTE — Anesthesia Preprocedure Evaluation (Signed)

## 2013-07-21 ENCOUNTER — Encounter (HOSPITAL_COMMUNITY): Payer: Self-pay | Admitting: *Deleted

## 2013-07-21 MED ORDER — PRENATAL MULTIVITAMIN CH
1.0000 | ORAL_TABLET | Freq: Every day | ORAL | Status: DC
  Administered 2013-07-21 – 2013-07-22 (×2): 1 via ORAL
  Filled 2013-07-21: qty 1

## 2013-07-21 MED ORDER — SIMETHICONE 80 MG PO CHEW: 80.0000 mg | CHEWABLE_TABLET | ORAL | Status: DC | PRN

## 2013-07-21 MED ORDER — LANOLIN HYDROUS EX OINT: TOPICAL_OINTMENT | CUTANEOUS | Status: DC | PRN

## 2013-07-21 MED ORDER — DIPHENHYDRAMINE HCL 25 MG PO CAPS: 25.0000 mg | ORAL_CAPSULE | Freq: Four times a day (QID) | ORAL | Status: DC | PRN

## 2013-07-21 MED ORDER — ZOLPIDEM TARTRATE 5 MG PO TABS: 5.0000 mg | ORAL_TABLET | Freq: Every evening | ORAL | Status: DC | PRN

## 2013-07-21 MED ORDER — DIBUCAINE 1 % RE OINT: 1.0000 "application " | TOPICAL_OINTMENT | RECTAL | Status: DC | PRN

## 2013-07-21 MED ORDER — ONDANSETRON HCL 4 MG/2ML IJ SOLN: 4.0000 mg | INTRAMUSCULAR | Status: DC | PRN

## 2013-07-21 MED ORDER — MEASLES, MUMPS & RUBELLA VAC ~~LOC~~ INJ
0.5000 mL | INJECTION | Freq: Once | SUBCUTANEOUS | Status: DC
  Filled 2013-07-21: qty 0.5

## 2013-07-21 MED ORDER — IBUPROFEN 600 MG PO TABS
600.0000 mg | ORAL_TABLET | Freq: Four times a day (QID) | ORAL | Status: DC
  Administered 2013-07-21 – 2013-07-22 (×6): 600 mg via ORAL
  Filled 2013-07-21 (×7): qty 1

## 2013-07-21 MED ORDER — ONDANSETRON HCL 4 MG PO TABS: 4.0000 mg | ORAL_TABLET | ORAL | Status: DC | PRN

## 2013-07-21 MED ORDER — BENZOCAINE-MENTHOL 20-0.5 % EX AERO: 1.0000 "application " | INHALATION_SPRAY | CUTANEOUS | Status: DC | PRN

## 2013-07-21 MED ORDER — OXYCODONE-ACETAMINOPHEN 5-325 MG PO TABS: 1.0000 | ORAL_TABLET | ORAL | Status: DC | PRN

## 2013-07-21 MED ORDER — SENNOSIDES-DOCUSATE SODIUM 8.6-50 MG PO TABS
2.0000 | ORAL_TABLET | Freq: Every day | ORAL | Status: DC
  Administered 2013-07-21: 2 via ORAL

## 2013-07-21 MED ORDER — TETANUS-DIPHTH-ACELL PERTUSSIS 5-2.5-18.5 LF-MCG/0.5 IM SUSP: 0.5000 mL | Freq: Once | INTRAMUSCULAR | Status: DC

## 2013-07-21 MED ORDER — WITCH HAZEL-GLYCERIN EX PADS: 1.0000 "application " | MEDICATED_PAD | CUTANEOUS | Status: DC | PRN

## 2013-07-21 NOTE — Anesthesia Postprocedure Evaluation (Signed)
Anesthesia Post Note  Patient: Monique Hickman  Procedure(s) Performed: * No procedures listed *  Anesthesia type: Epidural  Patient location: Mother/Baby  Post pain: Pain level controlled  Post assessment: Post-op Vital signs reviewed  Last Vitals:  Filed Vitals:   07/21/13 0520  BP: 107/60  Pulse: 85  Temp: 36.4 C  Resp: 20    Post vital signs: Reviewed  Level of consciousness:alert  Complications: No apparent anesthesia complications

## 2013-07-21 NOTE — Progress Notes (Signed)
Post Partum Day 1  Subjective: no complaints, up ad lib, voiding, tolerating PO and + flatus  Objective: Blood pressure 107/60, pulse 85, temperature 97.5 F (36.4 C), temperature source Oral, resp. rate 20, height 5\' 5"  (1.651 m), weight 178 lb (80.74 kg), last menstrual period 10/11/2012, SpO2 100.00%, unknown if currently breastfeeding.  Physical Exam:  General: alert and no distress Lochia: appropriate Uterine Fundus: firm DVT Evaluation: No evidence of DVT seen on physical exam. Negative Homan's sign.   Recent Labs  07/20/13 0350 07/20/13 2035  HGB 13.4 12.9  HCT 38.7 37.2    Assessment/Plan: Plan for discharge tomorrow, Breastfeeding and Contraception OCPs   LOS: 1 day   Monique Hickman A, M.D. 07/21/2013, 8:30 AM

## 2013-07-22 MED ORDER — IBUPROFEN 600 MG PO TABS: 600.0000 mg | ORAL_TABLET | Freq: Four times a day (QID) | ORAL | Status: DC | PRN

## 2013-07-22 NOTE — Lactation Note (Addendum)
This note was copied from the chart of Monique Janisha Bueso. Lactation Consultation Note    Follow up consult with this mom and baby. Mom was feeding in cradle hold when  Emlyn in the room. I unlatched baby with mom's permission, and relatched baby with cross cradle and football hold. He latched well, with audible swallows. With hand expression, mom's milk squirted across the bed, and milk was dripping down her abdomen, while she was breast feeding on the opposite breast. i tried to convince mom she id nto need to use DEP or formula, and that her baby was the best pump, and her milk was transitioning in. I explained how using formula will kep baby away from her breast, and decrease her supply. Mom stated the cross cradle latch "did not hurt that much".   MOm said she hates to hear her baby cry. I told her that is his only was of expressing himself for now, and that crying is ok. I also explained that he is cluster feeding, and that is his way to bring in her milk. I told her to avoid pumping for the first 3-4 weeks, and then she can get he WIC pump, and begin saving milk for going back to work. I placed all of mom's pump kit parts together in a bag for her. MGM has been telling mom she had no miulk - I tried to show her that in fact she did, and that I could heart the baby swallowing. Mom finally said she felt better ablut breast feeding now. She knows she can call for o/p lactation, and questions.   Patient Name: Monique Hickman [id number] Date: 07/22/2013 Reason for consult: Follow-up assessment   Maternal Data    Feeding Feeding Type: Breast Milk  LATCH Score/Interventions Latch: Grasps breast easily, tongue down, lips flanged, rhythmical sucking. Intervention(s): Adjust position;Assist with latch;Breast massage;Breast compression  Audible Swallowing: Spontaneous and intermittent  Type of Nipple: Everted at rest and after stimulation  Comfort (Breast/Nipple): Soft / non-tender     Hold  (Positioning): Assistance needed to correctly position infant at breast and maintain latch.  LATCH Score: 9  Lactation Tools Discussed/Used     Consult Status Consult Status: Complete Follow-up type: Call as needed    Alfred Levins 07/22/2013, 10:00 AM

## 2013-07-22 NOTE — Discharge Summary (Signed)
Obstetric Discharge Summary Reason for Admission: rupture of membranes Prenatal Procedures: ultrasound Intrapartum Procedures: spontaneous vaginal delivery Postpartum Procedures: none Complications-Operative and Postpartum:Labial lac repaired at birth Eating, drinking, voiding, ambulating well.  +flatus.  Lochia and pain wnl.  Denies dizziness, lightheadedness, or sob. Reports bilateral upper extremity soreness 'from pushing'  Hemoglobin  Date Value Range Status  07/20/2013 12.9  12.0 - 15.0 g/dL Final     HCT  Date Value Range Status  07/20/2013 37.2  36.0 - 46.0 % Final    Physical Exam:  General: alert, cooperative and no distress Lochia: appropriate Uterine Fundus: firm Incision: n/a DVT Evaluation: No evidence of DVT seen on physical exam. Negative Homan's sign. No cords or calf tenderness. No significant calf/ankle edema.  Discharge Diagnoses: Term Pregnancy-delivered  Discharge Information: Date: 07/22/2013 Activity: pelvic rest Diet: routine Medications: PNV and Ibuprofen Condition: stable Instructions: refer to practice specific booklet Discharge to: home Follow-up Information   [facility] with [facility] [place] In 6 weeks. (for postpartum visit)    Specialty:  Obstetrics and Gynecology   Contact information:   [address] [place] [address] [phone]      Newborn Data: Live born female  Birth Weight: 9 lb 1.4 oz (4122 g) APGAR: 4, 9  Home with mother. Breastfeeding, undecided about contraception- reviewed options-states she will think about it and discuss further at pp visit, educated on importance of abstinence until after pp visit to prevent unwanted pregnancy.  Plans OP circumcision  Marge Duncans 07/22/2013, 6:06 AM

## 2013-07-23 NOTE — Progress Notes (Signed)
Post discharge chart review completed.  

## 2013-07-25 ENCOUNTER — Inpatient Hospital Stay (HOSPITAL_COMMUNITY): Admission: RE | Admit: 2013-07-25 | Payer: Medicaid Other | Source: Ambulatory Visit

## 2013-09-14 ENCOUNTER — Encounter: Payer: Self-pay | Admitting: Advanced Practice Midwife

## 2013-09-14 ENCOUNTER — Ambulatory Visit (INDEPENDENT_AMBULATORY_CARE_PROVIDER_SITE_OTHER): Payer: Medicaid Other | Admitting: Advanced Practice Midwife

## 2013-09-14 NOTE — Progress Notes (Signed)
  Subjective:     Monique Hickman is a 27 y.o. female who presents for a postpartum visit. She is 8 weeks postpartum following a spontaneous vaginal delivery at term. I have fully reviewed the prenatal and intrapartum course.  Outcome: spontaneous vaginal delivery and shoulder dystocia resolved with McRoberts and suprapubic pressure. Anesthesia: epidural. Postpartum course has been normal. Baby's course has been normal. Baby is feeding by breast and bottle. Bleeding no bleeding. Bowel function is normal but with some constipation. Bladder function is normal. Patient is not sexually active. Contraception method is none. Postpartum depression screening: negative.  The following portions of the patient's history were reviewed and updated as appropriate: allergies, current medications, past family history, past medical history, past social history, past surgical history and problem list.  Review of Systems A comprehensive review of systems was negative.   Objective:    BP 117/62  Pulse 87  Resp 16  Ht 5\' 3"  (1.6 m)  Wt 156 lb (70.761 kg)  BMI 27.64 kg/m2  Breastfeeding? No  General:  alert, cooperative and no distress        Chest - clear to auscultation, no wheezes, rales or rhonchi, symmetric air entry Heart - normal rate, regular rhythm, normal S1, S2, no murmurs, rubs, clicks or gallops Pelvic - deferred   Assessment:     Normal postpartum exam.   Plan:    1. Contraception: interested in Mirena--discussed benefits/risks. Pt given literature.  Her husband is out of the country at present and she will make appointment later when he returns.  2. Discussed increased fiber and fluid intake, Colace BID for constipation 3. Follow up in: 1 year or as needed.

## 2014-08-21 IMAGING — US US OB COMP +14 WK
1 series · 12 of 28 positions shown · non-contrast
Comparison: none

[Series 1: us ob comp +14 wk · 82 acquisitions, 12 frames shown]
[im 4/82]
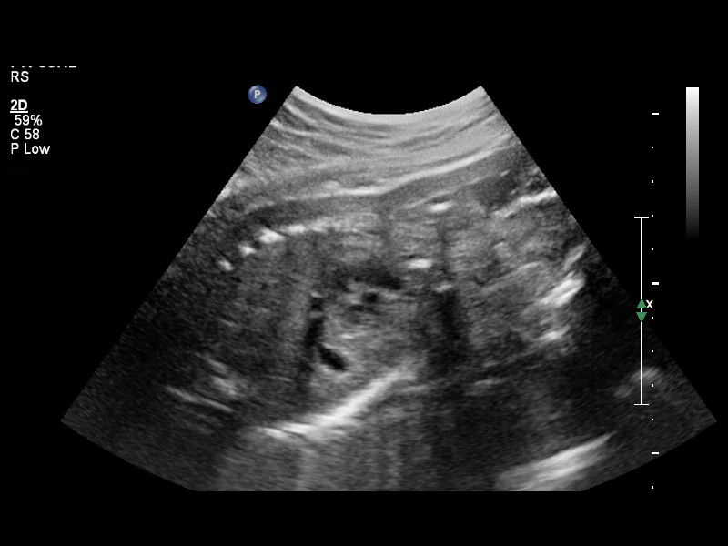
[im 10/82]
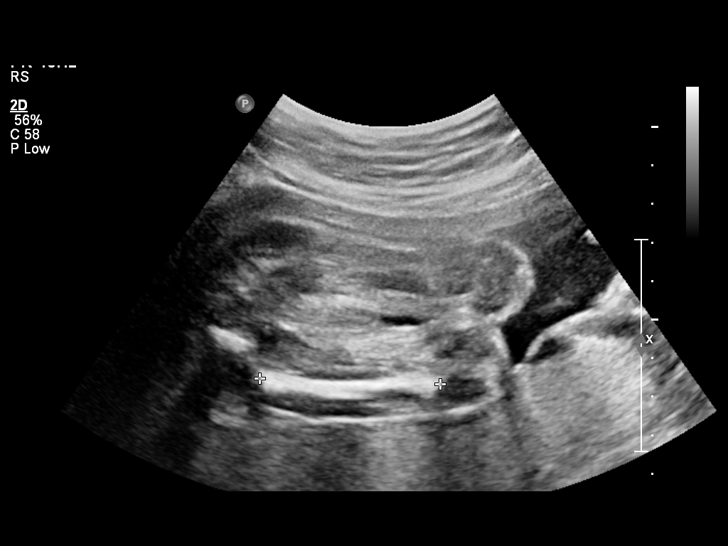
[im 16/82]
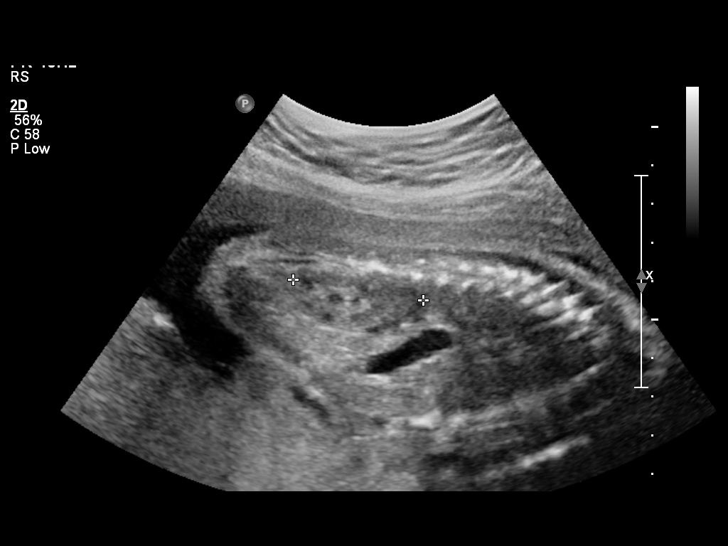
[im 25/82]
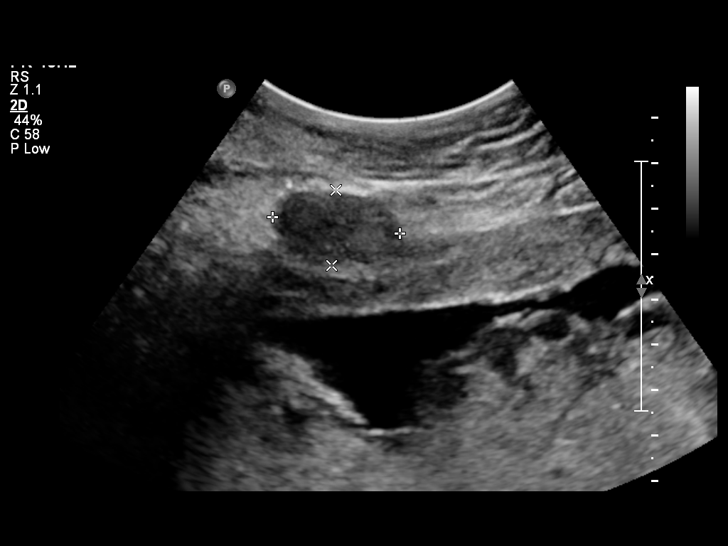
[im 31/82]
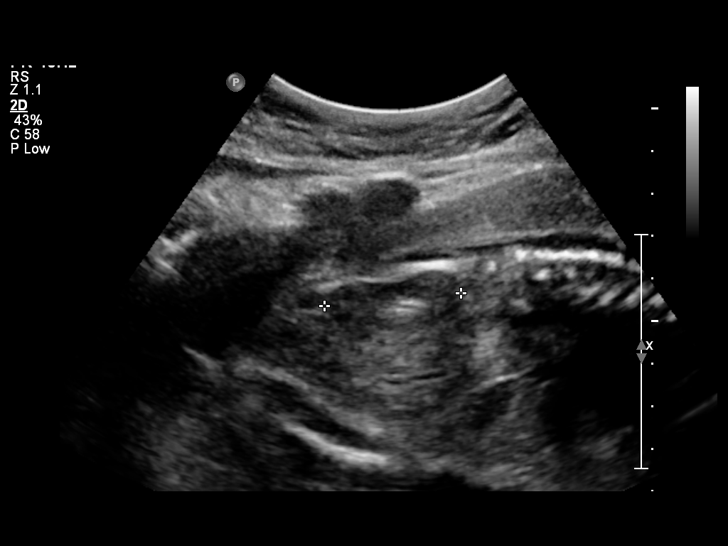
[im 37/82]
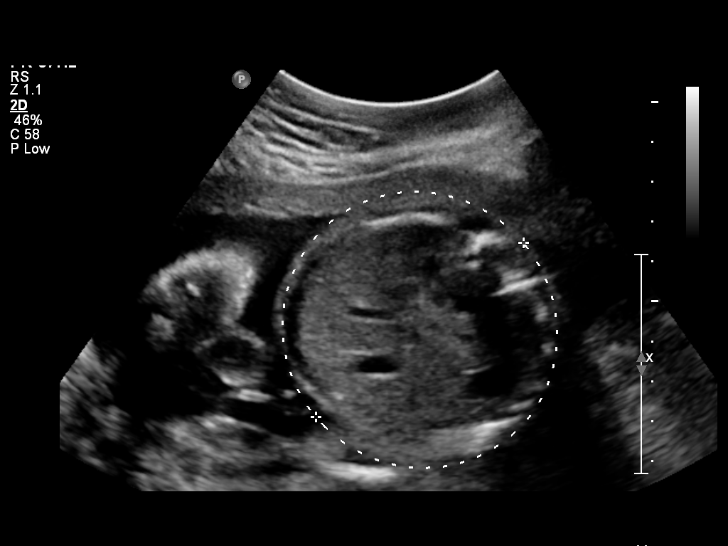
[im 46/82]
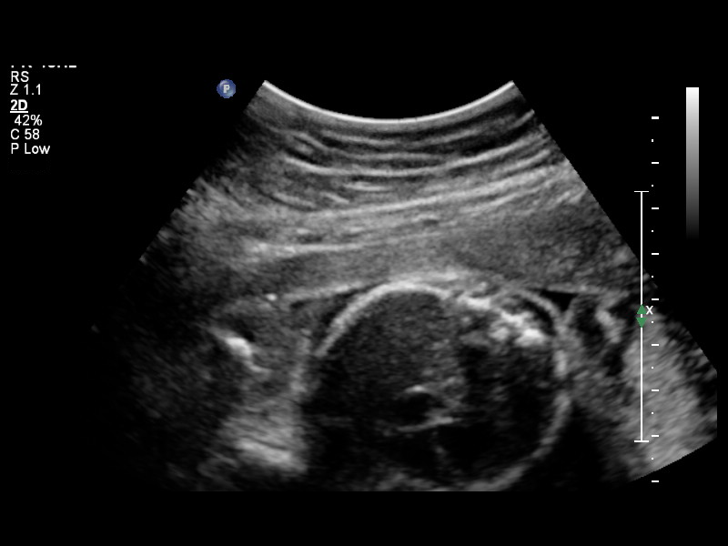
[im 52/82]
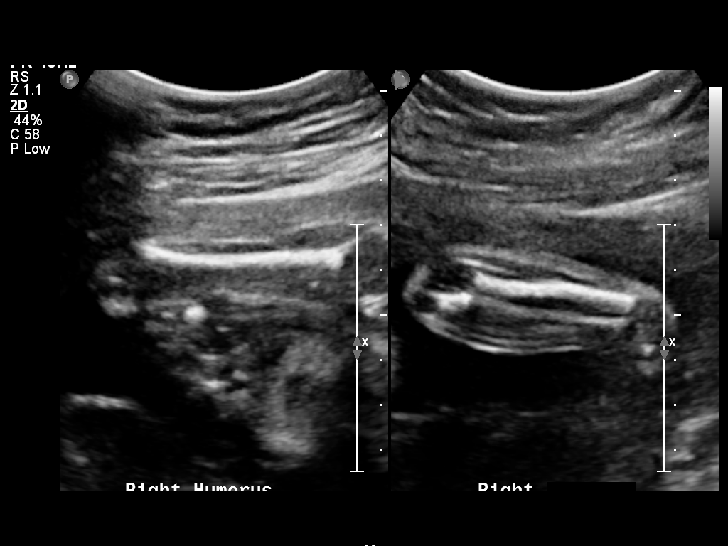
[im 58/82]
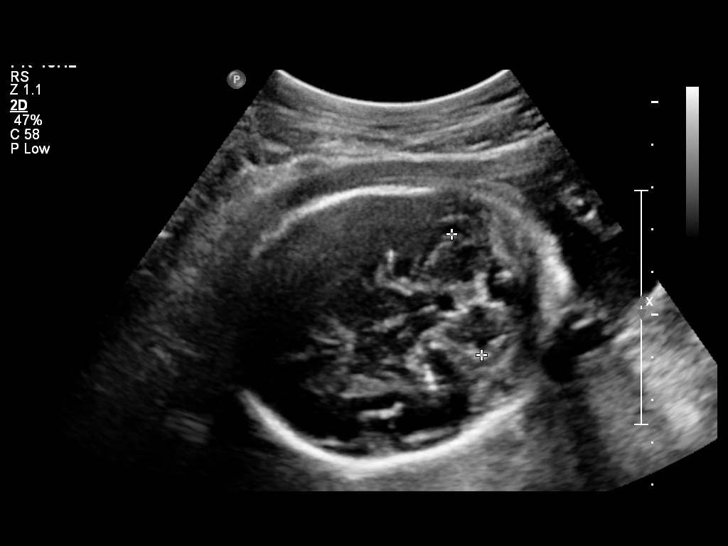
[im 67/82]
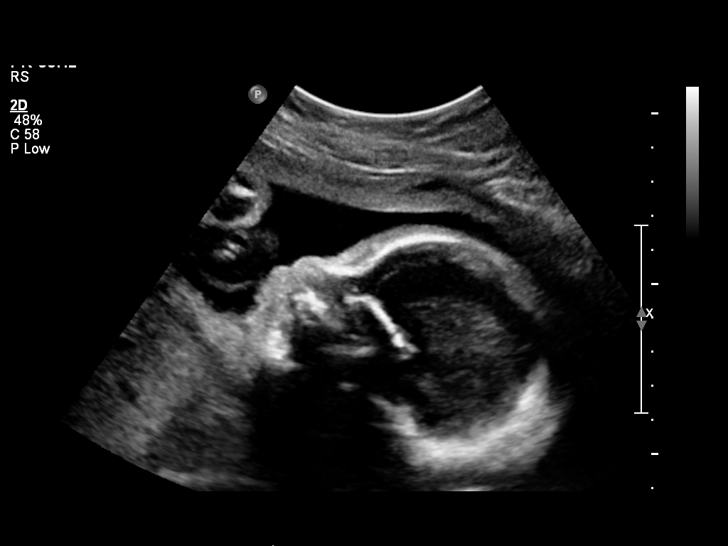
[im 73/82]
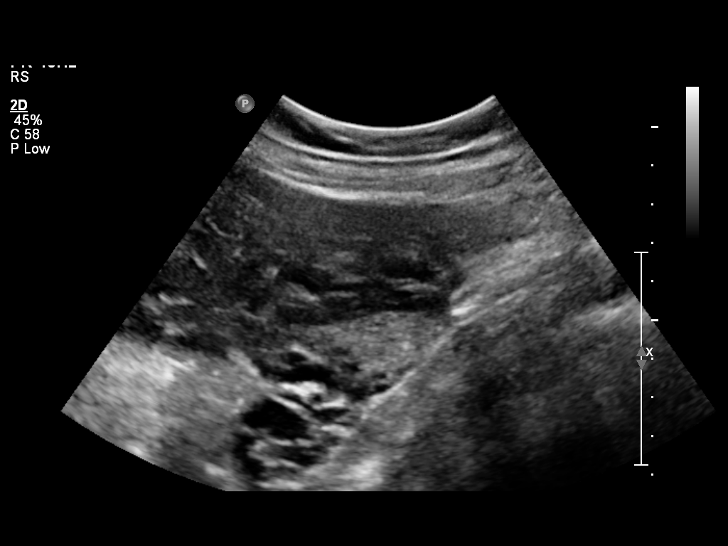
[im 79/82]
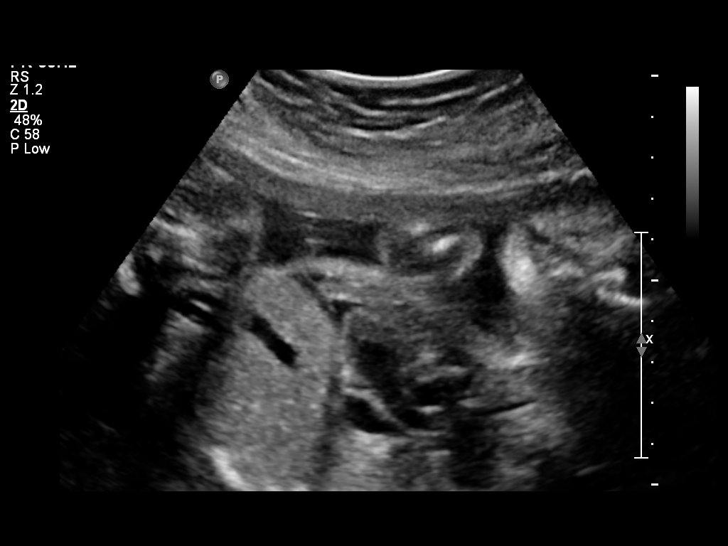

[12 of 28 positions shown; findings below may reference images not displayed]

OBSTETRICS REPORT
                      (Signed Final 04/12/2013 [DATE])

Service(s) Provided

 US OB COMP + 14 WK                                    76805.1
Indications

 Basic anatomic survey
 No or Little Prenatal Care
Fetal Evaluation

 Num Of Fetuses:    1
 Fetal Heart Rate:  181                         bpm
 Cardiac Activity:  Observed
 Presentation:      Cephalic
 Placenta:          Posterior, above cervical
                    os
 P. Cord            Visualized, central
 Insertion:

 Amniotic Fluid
 AFI FV:      Subjectively within normal limits
                                             Larg Pckt:     3.7  cm
Biometry

 BPD:     66.6  mm    G. Age:   26w 6d                CI:        81.66   70 - 86
                                                      FL/HC:      20.2   18.6 -

 HC:     232.6  mm    G. Age:   25w 2d        8  %    HC/AC:      1.08   1.04 -

 AC:     214.8  mm    G. Age:   26w 0d       37  %    FL/BPD:     70.4   71 - 87
 FL:      46.9  mm    G. Age:   25w 5d       22  %    FL/AC:      21.8   20 - 24
 CER:     29.1  mm    G. Age:   26w 0d       47  %
 Est. FW:     858  gm    1 lb 14 oz      48  %
Gestational Age

 LMP:           26w 1d       Date:   10/11/12                 EDD:   07/18/13
 U/S Today:     26w 0d                                        EDD:   07/19/13
 Best:          26w 1d    Det. By:   LMP  (10/11/12)          EDD:   07/18/13
Anatomy
 Cranium:          Appears normal         Ductal Arch:      Basic anatomy
                                                            exam per order
 Fetal Cavum:      Appears normal         Diaphragm:        Appears normal
 Ventricles:       Appears normal         Stomach:          Appears normal
 Choroid Plexus:   Appears normal         Abdomen:          Appears normal
 Cerebellum:       Appears normal         Abdominal Wall:   Not well visualized
 Posterior Fossa:  Appears normal         Cord Vessels:     Appears normal (3
                                                            vessel cord)
 Nuchal Fold:      Not applicable (>20    Kidneys:          Appear normal
                   wks GA)
 Lips:             Appears normal         Bladder:          Appears normal
 Heart:            Appears normal         Spine:            Appears normal
                   (4CH, axis, and
                   situs)
 RVOT:             Appears normal         Lower             Appears normal
                                          Extremities:
 LVOT:             Appears normal         Upper             Appears normal
                                          Extremities:
 Aortic Arch:      Basic anatomy
                   exam per order

 Other:  Heels visualized. Male gender.
Cervix Uterus Adnexa

 Cervical Length:   5.43      cm

 Cervix:       Normal appearance by transabdominal scan.

 Left Ovary:   Size(cm) L: 4.22 x W: 2.81 x H: 1.67  Volume(cc):

 Right Ovary:  Size(cm) L: 3.78 x W: 2.15 x H: 1.7  Volume(cc):
Myomas

 Site                     L(cm)      W(cm)      D(cm)       Location
 Anterior

 Blood Flow                  RI       PI       Comments

Impression

 Single intrauterine gestation demonstrating an estimated
 gestational age by ultrasound of 26w 0d. This is correlated
 with expected estimated gestational age by LMP of 26w 1d.
 EFW is currently at the 48%.

 Overall assessment is slightly compromised by gestational
 age and fetal position. Visualized anatomy appears normal.
 No focal placental abnormalities are seen.

 Focal fibroid with size and location as noted above.

 Subjectively and quantitatively normal amniotic fluid volume.

 Normal cervical length and appearance.
 questions or concerns.

## 2014-09-16 ENCOUNTER — Encounter: Payer: Self-pay | Admitting: Advanced Practice Midwife

## 2014-11-15 NOTE — L&D Delivery Note (Cosign Needed)
Delivery Note Patient initially presented to the MAU with contractions and was dilated to 4.5 cm. She was admitted and quickly progressed to completion and delivered after pushing through several contractions.   At 9:12 AM a viable and healthy female was delivered via Vaginal, Spontaneous Delivery (Presentation: Left Occiput Anterior).  APGAR: 8, 9; weight 8 lb 11.2 oz (3946 g).   Placenta status: Intact, Spontaneous.  Cord: 3 vessels with the following complications: None.  Cord pH: N/A  Anesthesia: None  Episiotomy: None Lacerations: None Suture Repair: N/A Est. Blood Loss (mL): 200 ml  Mom to postpartum.  Baby to Couplet care / Skin to Skin.  Hillary Vernie AmmonsM Fitzgerald 08/28/2015, 10:01 AM   OB FELLOW DELIVERY ATTESTATION  I was gloved and present for delivery in its entirety and agree with the above resident's note.   Silvano BilisNoah B Danaly Bari, MD 3:46 PM

## 2015-03-11 ENCOUNTER — Encounter: Payer: Medicaid Other | Admitting: Advanced Practice Midwife

## 2015-03-18 ENCOUNTER — Ambulatory Visit (INDEPENDENT_AMBULATORY_CARE_PROVIDER_SITE_OTHER): Payer: Medicaid Other | Admitting: Obstetrics & Gynecology

## 2015-03-18 ENCOUNTER — Encounter: Payer: Self-pay | Admitting: Obstetrics & Gynecology

## 2015-03-18 ENCOUNTER — Other Ambulatory Visit (HOSPITAL_COMMUNITY)
Admission: RE | Admit: 2015-03-18 | Discharge: 2015-03-18 | Disposition: A | Discharge status: Home / Self Care | Payer: Medicaid Other | Source: Ambulatory Visit | Attending: Obstetrics & Gynecology | Admitting: Obstetrics & Gynecology

## 2015-03-18 VITALS — BP 118/82 | HR 98 | Wt 164.0 lb

## 2015-03-18 DIAGNOSIS — Z8759 Personal history of other complications of pregnancy, childbirth and the puerperium: Secondary | ICD-10-CM | POA: Insufficient documentation

## 2015-03-18 DIAGNOSIS — Z124 Encounter for screening for malignant neoplasm of cervix: Secondary | ICD-10-CM

## 2015-03-18 DIAGNOSIS — A630 Anogenital (venereal) warts: Secondary | ICD-10-CM

## 2015-03-18 DIAGNOSIS — Z113 Encounter for screening for infections with a predominantly sexual mode of transmission: Secondary | ICD-10-CM

## 2015-03-18 DIAGNOSIS — Z3490 Encounter for supervision of normal pregnancy, unspecified, unspecified trimester: Secondary | ICD-10-CM | POA: Insufficient documentation

## 2015-03-18 DIAGNOSIS — O09292 Supervision of pregnancy with other poor reproductive or obstetric history, second trimester: Secondary | ICD-10-CM | POA: Diagnosis not present

## 2015-03-18 DIAGNOSIS — N760 Acute vaginitis: Secondary | ICD-10-CM | POA: Insufficient documentation

## 2015-03-18 DIAGNOSIS — Z3492 Encounter for supervision of normal pregnancy, unspecified, second trimester: Secondary | ICD-10-CM | POA: Diagnosis not present

## 2015-03-18 DIAGNOSIS — Z01419 Encounter for gynecological examination (general) (routine) without abnormal findings: Secondary | ICD-10-CM | POA: Insufficient documentation

## 2015-03-18 MED ORDER — FLUCONAZOLE 150 MG PO TABS: 150.0000 mg | ORAL_TABLET | Freq: Once | ORAL | Status: DC

## 2015-03-18 NOTE — Progress Notes (Signed)
Bedside US shows Single IUP measuring HC 13.2cm G5266w6d and FHR 143

## 2015-03-18 NOTE — Progress Notes (Signed)
   Subjective:    Monique HumbleOsasogie Leonhardt is a G3P1011 7721w6d being seen today for her first obstetrical visit.  Her obstetrical history is significant for history of shoulder dystocia,  Pregnancy history fully reviewed.  Patient reports bleeding several weeks ago [facility]  Filed Vitals:   03/18/15 1516  BP: 118/82  Pulse: 98  Weight: 164 lb (74.39 kg)    HISTORY: OB History  Gravida Para Term Preterm AB SAB TAB Ectopic Multiple Living  3 1 1  1   1  1     # Outcome Date GA Lbr Len/2nd Weight Sex Delivery Anes PTL Lv  3 Current           2 Term 07/20/13 5389w2d  9 lb 1.4 oz (4.122 kg) M Vag-Spont EPI  Y  1 Ectopic 03/30/02             Past Medical History  Diagnosis Date  . Ectopic pregnancy 2003    Lapt     Past Surgical History  Procedure Laterality Date  . Laparotomy      Removal of tube   History reviewed. No pertinent family history.   Exam    Uterus:     Pelvic Exam:    Perineum: Warts around anus   Vulva: normal   Vagina:  curdlike discharge   pH: n/a   Cervix: external os is 1 sm with good length and internal closed with tone   Adnexa: normal adnexa   Bony Pelvis: gynecoid  System: Breast:  normal appearance, no masses or tenderness   Skin: normal coloration and turgor, no rashes    Neurologic: oriented, normal mood   Extremities: normal strength, tone, and muscle mass   HEENT sclera clear, anicteric, oropharynx clear, no lesions and neck supple with midline trachea   Mouth/Teeth mucous membranes moist, pharynx normal without lesions and dental hygiene good   Neck supple and no masses   Cardiovascular: regular rate and rhythm   Respiratory:  appears well, vitals normal, no respiratory distress, acyanotic, normal RR, chest clear, no wheezing, crepitations, rhonchi, normal symmetric air entry   Abdomen: soft, non-tender; bowel sounds normal; no masses,  no organomegaly   Urinary: urethral meatus normal      Assessment:    Pregnancy:  [id number] Patient Active Problem List   Diagnosis Date Noted  . History of shoulder dystocia in prior pregnancy, currently pregnant in second trimester 03/18/2015  . History of ectopic pregnancy 03/18/2015        Plan:     Initial labs drawn. Prenatal vitamins. Problem list reviewed and updated. Genetic Screening discussed:  Patient left before genetic discussion.   Ultrasound discussed; fetal survey: ordered.  Follow up in 1 weeks--if wants genetic testing. Diflucan for yeast TCA warts Discuss genetics testing next week   Luva Metzger H. 03/18/2015

## 2015-03-19 ENCOUNTER — Encounter: Payer: Self-pay | Admitting: *Deleted

## 2015-03-19 LAB — PRENATAL PROFILE (SOLSTAS)
ANTIBODY SCREEN: NEGATIVE
Basophils Absolute: 0 10*3/uL (ref 0.0–0.1)
Basophils Relative: 0 % (ref 0–1)
Eosinophils Absolute: 0.2 10*3/uL (ref 0.0–0.7)
Eosinophils Relative: 3 % (ref 0–5)
HEMATOCRIT: 35.9 % — AB (ref 36.0–46.0)
HEMOGLOBIN: 11.9 g/dL — AB (ref 12.0–15.0)
HEP B S AG: NEGATIVE
HIV: NONREACTIVE
LYMPHS ABS: 1.6 10*3/uL (ref 0.7–4.0)
Lymphocytes Relative: 23 % (ref 12–46)
MCH: 28.7 pg (ref 26.0–34.0)
MCHC: 33.1 g/dL (ref 30.0–36.0)
MCV: 86.7 fL (ref 78.0–100.0)
MONOS PCT: 7 % (ref 3–12)
MPV: 11.2 fL (ref 8.6–12.4)
Monocytes Absolute: 0.5 10*3/uL (ref 0.1–1.0)
NEUTROS ABS: 4.7 10*3/uL (ref 1.7–7.7)
NEUTROS PCT: 67 % (ref 43–77)
Platelets: 210 10*3/uL (ref 150–400)
RBC: 4.14 MIL/uL (ref 3.87–5.11)
RDW: 14.9 % (ref 11.5–15.5)
RUBELLA: 9.74 {index} — AB (ref <0.90)
Rh Type: POSITIVE
WBC: 7 10*3/uL (ref 4.0–10.5)

## 2015-03-20 LAB — CULTURE, OB URINE: Colony Count: 30000

## 2015-03-20 LAB — GC/CHLAMYDIA PROBE AMP
CT PROBE, AMP APTIMA: NEGATIVE
GC PROBE AMP APTIMA: NEGATIVE

## 2015-03-21 ENCOUNTER — Other Ambulatory Visit: Payer: Self-pay | Admitting: Obstetrics & Gynecology

## 2015-03-21 DIAGNOSIS — Z3492 Encounter for supervision of normal pregnancy, unspecified, second trimester: Secondary | ICD-10-CM

## 2015-03-21 LAB — CERVICOVAGINAL ANCILLARY ONLY
BACTERIAL VAGINITIS: POSITIVE — AB
Candida vaginitis: NEGATIVE

## 2015-03-21 LAB — CYTOLOGY - PAP

## 2015-03-21 MED ORDER — AMOXICILLIN 500 MG PO CAPS: 500.0000 mg | ORAL_CAPSULE | Freq: Three times a day (TID) | ORAL | Status: DC

## 2015-03-21 MED ORDER — METRONIDAZOLE 500 MG PO TABS
500.0000 mg | ORAL_TABLET | Freq: Two times a day (BID) | ORAL | Status: DC
Start: 2015-03-21 — End: 2015-06-06

## 2015-03-24 ENCOUNTER — Telehealth: Payer: Self-pay | Admitting: *Deleted

## 2015-03-24 NOTE — Telephone Encounter (Signed)
Called pt to adv Diflucan, Flagyl, and Amoxil sent to pharm. Explained she would need to complete Flagyl and Amoxil before taking Diflucan. Pt will pick up meds today and begin.

## 2015-03-24 NOTE — Telephone Encounter (Signed)
-----   Message from Lesly DukesKelly H Leggett, MD sent at 03/21/2015  1:59 PM EDT ----- gbs + uti---treat now for uti and Rx in labor

## 2015-03-27 ENCOUNTER — Telehealth: Payer: Self-pay | Admitting: *Deleted

## 2015-03-27 NOTE — Telephone Encounter (Signed)
-----   Message from Kelly H Leggett, MD sent at 03/26/2015  7:18 PM EDT ----- Did someone call the patient???  Re:  Genetic testing??  (see previous message for more detail).   ----- Message -----    From: Mandy J Hutchinson, CMA    Sent: 03/24/2015  12:55 PM      To: Kelly H Leggett, MD  Pt is not scheduled to see provider until 04/18/15. Pt stated she can wait for TCA at next visit as it is not causing enough issues to be treated right away.   MH  ----- Message -----    From: Kelly H Leggett, MD    Sent: 03/18/2015   8:17 PM      To: Tanya D Buckson, #  Does patient have an appt in 1 week.  She needs genettics talk as well as TCA application.  Please make sure next provider knows if not me.   

## 2015-03-27 NOTE — Telephone Encounter (Signed)
Ordering Quad per Dr Penne LashLeggett

## 2015-03-27 NOTE — Telephone Encounter (Signed)
-----   Message from Lesly DukesKelly H Leggett, MD sent at 03/26/2015  7:18 PM EDT ----- Did someone call the patient???  Re:  Genetic testing??  (see previous message for more detail).   ----- Message -----    From: Arne ClevelandMandy J Rondy Krupinski, CMA    Sent: 03/24/2015  12:55 PM      To: Lesly DukesKelly H Leggett, MD  Pt is not scheduled to see provider until 04/18/15. Pt stated she can wait for TCA at next visit as it is not causing enough issues to be treated right away.   MH  ----- Message -----    From: Lesly DukesKelly H Leggett, MD    Sent: 03/18/2015   8:17 PM      To: Cephas Darbyanya D Buckson, #  Does patient have an appt in 1 week.  She needs genettics talk as well as TCA application.  Please make sure next provider knows if not me.

## 2015-03-27 NOTE — Telephone Encounter (Signed)
Pt does want genetic screening and will come in tomorrow 03/28/15

## 2015-03-28 ENCOUNTER — Other Ambulatory Visit (INDEPENDENT_AMBULATORY_CARE_PROVIDER_SITE_OTHER): Payer: Medicaid Other | Admitting: *Deleted

## 2015-03-28 ENCOUNTER — Ambulatory Visit (HOSPITAL_COMMUNITY)
Admission: RE | Admit: 2015-03-28 | Discharge: 2015-03-28 | Disposition: A | Discharge status: Home / Self Care | Payer: Medicaid Other | Source: Ambulatory Visit | Attending: Obstetrics & Gynecology | Admitting: Obstetrics & Gynecology

## 2015-03-28 ENCOUNTER — Other Ambulatory Visit: Payer: Self-pay | Admitting: Obstetrics & Gynecology

## 2015-03-28 DIAGNOSIS — Z3689 Encounter for other specified antenatal screening: Secondary | ICD-10-CM | POA: Insufficient documentation

## 2015-03-28 DIAGNOSIS — O09292 Supervision of pregnancy with other poor reproductive or obstetric history, second trimester: Secondary | ICD-10-CM

## 2015-03-28 DIAGNOSIS — Z36 Encounter for antenatal screening of mother: Secondary | ICD-10-CM | POA: Diagnosis not present

## 2015-03-28 DIAGNOSIS — Z8759 Personal history of other complications of pregnancy, childbirth and the puerperium: Secondary | ICD-10-CM

## 2015-03-28 DIAGNOSIS — Z3492 Encounter for supervision of normal pregnancy, unspecified, second trimester: Secondary | ICD-10-CM

## 2015-03-28 DIAGNOSIS — Z3482 Encounter for supervision of other normal pregnancy, second trimester: Secondary | ICD-10-CM

## 2015-03-28 DIAGNOSIS — O4402 Placenta previa specified as without hemorrhage, second trimester: Secondary | ICD-10-CM | POA: Insufficient documentation

## 2015-03-28 DIAGNOSIS — Z3A18 18 weeks gestation of pregnancy: Secondary | ICD-10-CM | POA: Insufficient documentation

## 2015-04-02 ENCOUNTER — Telehealth: Payer: Self-pay | Admitting: *Deleted

## 2015-04-02 ENCOUNTER — Encounter: Payer: Self-pay | Admitting: *Deleted

## 2015-04-02 DIAGNOSIS — O4402 Placenta previa specified as without hemorrhage, second trimester: Secondary | ICD-10-CM

## 2015-04-02 LAB — AFP, QUAD SCREEN
AFP: 61.2 ng/mL
CURR GEST AGE: 18.5 wks.days
HCG, Total: 19.64 IU/mL
INH: 88.3 pg/mL
INTERPRETATION-AFP: NEGATIVE
MOM FOR INH: 0.54
MoM for AFP: 1.18
MoM for hCG: 0.78
Open Spina bifida: NEGATIVE
Osb Risk: 1:14400 {titer}
Tri 18 Scr Risk Est: NEGATIVE
Trisomy 18 (Edward) Syndrome Interp.: 1:68700 {titer}
uE3 Mom: 1.32
uE3 Value: 1.92 ng/mL

## 2015-04-02 NOTE — Telephone Encounter (Signed)
LM on voicemail to call office to discuss recent pregnancy U/S.

## 2015-04-02 NOTE — Telephone Encounter (Signed)
Pt came by office to discuss U/S report.  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  Informed patient to call with any questions or concerns but if any bleeding noted to go straight to the MAU.

## 2015-04-02 NOTE — Telephone Encounter (Signed)
Opened in error

## 2015-04-02 NOTE — Telephone Encounter (Signed)
-----   Message from Lesly DukesKelly H Leggett, MD sent at 04/01/2015  4:40 PM EDT ----- Pt has placenta previa.  She needs to be on vaginal rest.  She also needs an appt in 6 weeks from last US to finish anatomy scan and look at placenta.  Please make her an appt and inform her of the previa instructions.

## 2015-04-18 ENCOUNTER — Ambulatory Visit (INDEPENDENT_AMBULATORY_CARE_PROVIDER_SITE_OTHER): Payer: Medicaid Other | Admitting: Family

## 2015-04-18 VITALS — BP 125/70 | HR 90 | Wt 171.0 lb

## 2015-04-18 DIAGNOSIS — O4412 Placenta previa with hemorrhage, second trimester: Secondary | ICD-10-CM

## 2015-04-18 DIAGNOSIS — Z3492 Encounter for supervision of normal pregnancy, unspecified, second trimester: Secondary | ICD-10-CM

## 2015-04-18 DIAGNOSIS — Z3482 Encounter for supervision of other normal pregnancy, second trimester: Secondary | ICD-10-CM

## 2015-04-18 NOTE — Progress Notes (Signed)
Discussed placenta previa and it's implication.  Pt denies any vaginal bleeding.  Informed to abstain from sex.  Follow-up ultrasound scheduled for 6/13.

## 2015-04-28 ENCOUNTER — Ambulatory Visit (HOSPITAL_COMMUNITY)
Admission: RE | Admit: 2015-04-28 | Discharge: 2015-04-28 | Disposition: A | Discharge status: Home / Self Care | Payer: Medicaid Other | Source: Ambulatory Visit | Attending: Obstetrics & Gynecology | Admitting: Obstetrics & Gynecology

## 2015-04-28 DIAGNOSIS — Z3492 Encounter for supervision of normal pregnancy, unspecified, second trimester: Secondary | ICD-10-CM

## 2015-04-28 DIAGNOSIS — Z0489 Encounter for examination and observation for other specified reasons: Secondary | ICD-10-CM | POA: Insufficient documentation

## 2015-04-28 DIAGNOSIS — Z3A22 22 weeks gestation of pregnancy: Secondary | ICD-10-CM | POA: Insufficient documentation

## 2015-04-28 DIAGNOSIS — O4412 Placenta previa with hemorrhage, second trimester: Secondary | ICD-10-CM | POA: Insufficient documentation

## 2015-04-28 DIAGNOSIS — O4400 Placenta previa specified as without hemorrhage, unspecified trimester: Secondary | ICD-10-CM | POA: Insufficient documentation

## 2015-04-28 DIAGNOSIS — O4402 Placenta previa specified as without hemorrhage, second trimester: Secondary | ICD-10-CM

## 2015-04-28 DIAGNOSIS — IMO0002 Reserved for concepts with insufficient information to code with codable children: Secondary | ICD-10-CM | POA: Insufficient documentation

## 2015-05-04 DIAGNOSIS — Z3490 Encounter for supervision of normal pregnancy, unspecified, unspecified trimester: Secondary | ICD-10-CM | POA: Insufficient documentation

## 2015-05-16 ENCOUNTER — Ambulatory Visit (INDEPENDENT_AMBULATORY_CARE_PROVIDER_SITE_OTHER): Payer: Medicaid Other | Admitting: Advanced Practice Midwife

## 2015-05-16 ENCOUNTER — Encounter: Payer: Self-pay | Admitting: Advanced Practice Midwife

## 2015-05-16 VITALS — BP 101/63 | HR 97 | Wt 174.0 lb

## 2015-05-16 DIAGNOSIS — Z3482 Encounter for supervision of other normal pregnancy, second trimester: Secondary | ICD-10-CM

## 2015-05-16 DIAGNOSIS — Z3492 Encounter for supervision of normal pregnancy, unspecified, second trimester: Secondary | ICD-10-CM

## 2015-05-16 NOTE — Patient Instructions (Signed)

## 2015-05-16 NOTE — Progress Notes (Signed)
Previa resolved. Wants to go back to work. Note given to return. GTT at [place].

## 2015-06-06 ENCOUNTER — Ambulatory Visit (INDEPENDENT_AMBULATORY_CARE_PROVIDER_SITE_OTHER): Payer: Medicaid Other | Admitting: Family

## 2015-06-06 VITALS — BP 110/71 | HR 99 | Wt 174.0 lb

## 2015-06-06 DIAGNOSIS — Z36 Encounter for antenatal screening of mother: Secondary | ICD-10-CM

## 2015-06-06 DIAGNOSIS — Z23 Encounter for immunization: Secondary | ICD-10-CM

## 2015-06-06 DIAGNOSIS — Z3493 Encounter for supervision of normal pregnancy, unspecified, third trimester: Secondary | ICD-10-CM

## 2015-06-06 DIAGNOSIS — Z3492 Encounter for supervision of normal pregnancy, unspecified, second trimester: Secondary | ICD-10-CM

## 2015-06-06 DIAGNOSIS — Z3402 Encounter for supervision of normal first pregnancy, second trimester: Secondary | ICD-10-CM

## 2015-06-06 LAB — CBC
HEMATOCRIT: 38.2 % (ref 36.0–46.0)
HEMOGLOBIN: 12.4 g/dL (ref 12.0–15.0)
MCH: 29.2 pg (ref 26.0–34.0)
MCHC: 32.5 g/dL (ref 30.0–36.0)
MCV: 90.1 fL (ref 78.0–100.0)
MPV: 11.8 fL (ref 8.6–12.4)
Platelets: 171 10*3/uL (ref 150–400)
RBC: 4.24 MIL/uL (ref 3.87–5.11)
RDW: 15 % (ref 11.5–15.5)
WBC: 7.1 10*3/uL (ref 4.0–10.5)

## 2015-06-06 MED ORDER — TETANUS-DIPHTH-ACELL PERTUSSIS 5-2.5-18.5 LF-MCG/0.5 IM SUSP
0.5000 mL | Freq: Once | INTRAMUSCULAR | Status: AC
Start: 2015-06-06 — End: 2015-06-06
  Administered 2015-06-06: 0.5 mL via INTRAMUSCULAR

## 2015-06-06 NOTE — Progress Notes (Signed)
Subjective:   Monique Hickman  is a 29 y.o.  female being seen today for her obstetrical visit. She is at  [redacted]w[redacted]d . Patient reports no complaints. Fetal movement: normal.  Pt discusses desire for csection to have it planned.    Menstrual History: OB History    Gravida Para Term Preterm AB TAB SAB Ectopic Multiple Living   1               The following portions of the patient's history were reviewed and updated as appropriate: allergies, current medications, past family history, past medical history, past social history, past surgical history and problem list.  Review of Systems Constitutional: negative Genitourinary:negative; pt denies vaginal bleeding or leaking of fluid.  Reports no contractions.     Objective:   Filed Vitals:   06/06/15 1008  BP: 110/71  Pulse: 99    FHT:  148 BPM  Uterine Size: 30 cm        Assessment:   G3P1011  at [redacted]w[redacted]d wks   Plan:   Third trimester labs obtained today (1 hr, RPR, CBC, HIV).   Discussed that csection is only completed in cases of emergency and medical indication.   Follow up in 2 Weeks.

## 2015-06-06 NOTE — Progress Notes (Signed)
29 w labs and Tdap today

## 2015-06-07 LAB — HIV ANTIBODY (ROUTINE TESTING W REFLEX): HIV 1&2 Ab, 4th Generation: NONREACTIVE

## 2015-06-07 LAB — RPR

## 2015-06-07 LAB — GLUCOSE TOLERANCE, 1 HOUR (50G) W/O FASTING: Glucose, 1 Hour GTT: 108 mg/dL (ref 70–140)

## 2015-06-09 ENCOUNTER — Telehealth: Payer: Self-pay | Admitting: *Deleted

## 2015-06-09 NOTE — Telephone Encounter (Signed)
Pt notified of normal 1 hr GTT. 

## 2015-06-20 ENCOUNTER — Encounter: Payer: Medicaid Other | Admitting: Family

## 2015-06-24 ENCOUNTER — Ambulatory Visit (INDEPENDENT_AMBULATORY_CARE_PROVIDER_SITE_OTHER): Payer: Medicaid Other | Admitting: Obstetrics & Gynecology

## 2015-06-24 ENCOUNTER — Encounter: Payer: Self-pay | Admitting: Obstetrics & Gynecology

## 2015-06-24 VITALS — BP 112/63 | HR 100 | Wt 175.0 lb

## 2015-06-24 DIAGNOSIS — Z3483 Encounter for supervision of other normal pregnancy, third trimester: Secondary | ICD-10-CM

## 2015-06-24 DIAGNOSIS — Z3492 Encounter for supervision of normal pregnancy, unspecified, second trimester: Secondary | ICD-10-CM

## 2015-06-24 MED ORDER — FLUCONAZOLE 150 MG PO TABS: 150.0000 mg | ORAL_TABLET | Freq: Once | ORAL | Status: DC

## 2015-06-24 NOTE — Addendum Note (Signed)
Addended by: Allie Bossier on: 06/24/2015 02:11 PM   Modules accepted: Orders

## 2015-06-24 NOTE — Progress Notes (Signed)
Vaginal d/c and itching?

## 2015-06-24 NOTE — Progress Notes (Signed)
Subjective:  Shalaunda Weatherholtz is a 29 y.o. G3P1011 at [redacted]w[redacted]d being seen today for ongoing prenatal care.  Patient reports vaginal irritation.  Contractions: Not present.  Vag. Bleeding: None. Movement: Present. Denies leaking of fluid.   The following portions of the patient's history were reviewed and updated as appropriate: allergies, current medications, past family history, past medical history, past social history, past surgical history and problem list.   Objective:   Filed Vitals:   06/24/15 1314  BP: 112/63  Pulse: 100  Weight: 175 lb (79.379 kg)    Fetal Status: Fetal Heart Rate (bpm): 145   Movement: Present     General:  Alert, oriented and cooperative. Patient is in no acute distress.  Skin: Skin is warm and dry. No rash noted.   Cardiovascular: Normal heart rate noted  Respiratory: Normal respiratory effort, no problems with respiration noted  Abdomen: Soft, gravid, appropriate for gestational age. Pain/Pressure: Absent     Pelvic: Vag. Bleeding: None Vag D/C Character: Curdy   Cervical exam deferred       Large amount of curdy discharge in vaginal vault  Extremities: Normal range of motion.  Edema: None  Mental Status: Normal mood and affect. Normal behavior. Normal judgment and thought content.   Urinalysis: Urine Protein: Negative Urine Glucose: Negative  Assessment and Plan:  Pregnancy: G3P1011 at [redacted]w[redacted]d  1. Supervision of low-risk pregnancy, second trimester diflucan  Preterm labor symptoms and general obstetric precautions including but not limited to vaginal bleeding, contractions, leaking of fluid and fetal movement were reviewed in detail with the patient. Please refer to After Visit Summary for other counseling recommendations.  Return in about 2 weeks (around 07/08/2015).   Allie Bossier, MD

## 2015-07-07 ENCOUNTER — Ambulatory Visit (INDEPENDENT_AMBULATORY_CARE_PROVIDER_SITE_OTHER): Payer: Medicaid Other | Admitting: Advanced Practice Midwife

## 2015-07-07 VITALS — BP 112/58 | HR 104 | Wt 176.0 lb

## 2015-07-07 DIAGNOSIS — O09293 Supervision of pregnancy with other poor reproductive or obstetric history, third trimester: Secondary | ICD-10-CM

## 2015-07-07 NOTE — Progress Notes (Signed)
Subjective:  Monique Hickman is a 29 y.o. G3P1011 at [redacted]w[redacted]d being seen today for ongoing prenatal care.  Patient reports backache.  She reports fears about this labor/birth because of shoulder dystocia last time.  Her first baby was over 9 lbs with 30 second shoulder dystocia. Mother and baby recovered well with no long term complications.  She feels bigger in this pregnancy than with her first. She desires a vaginal delivery but is worried and wants to know about primary C/S if it is recommended. Contractions: Not present.  Vag. Bleeding: None. Movement: Present. Denies leaking of fluid.   The following portions of the patient's history were reviewed and updated as appropriate: allergies, current medications, past family history, past medical history, past social history, past surgical history and problem list.   Objective:   Filed Vitals:   07/07/15 1009  BP: 112/58  Pulse: 104  Weight: 176 lb (79.833 kg)    Fetal Status: Fetal Heart Rate (bpm): 148 Fundal Height: 33 cm Movement: Present     General:  Alert, oriented and cooperative. Patient is in no acute distress.  Skin: Skin is warm and dry. No rash noted.   Cardiovascular: Normal heart rate noted  Respiratory: Normal respiratory effort, no problems with respiration noted  Abdomen: Soft, gravid, appropriate for gestational age. Pain/Pressure: Absent     Pelvic: Vag. Bleeding: None Vag D/C Character: Thin   Cervical exam deferred        Extremities: Normal range of motion.  Edema: None  Mental Status: Normal mood and affect. Normal behavior. Normal judgment and thought content.   Urinalysis: Urine Protein: Negative Urine Glucose: Negative  Assessment and Plan:  Pregnancy: G3P1011 at [redacted]w[redacted]d  1. History of shoulder dystocia in prior pregnancy, currently pregnant, third trimester   - US OB Follow Up; Future.  Plan for ultrasound for EFW, then discuss options for delivery at next prenatal appointment.   Preterm labor symptoms and  general obstetric precautions including but not limited to vaginal bleeding, contractions, leaking of fluid and fetal movement were reviewed in detail with the patient.  Please refer to After Visit Summary for other counseling recommendations.  No Follow-up on file.   Hurshel Party, CNM

## 2015-07-08 ENCOUNTER — Encounter: Payer: Medicaid Other | Admitting: Obstetrics & Gynecology

## 2015-07-09 ENCOUNTER — Inpatient Hospital Stay (HOSPITAL_COMMUNITY)
Admission: AD | Admit: 2015-07-09 | Discharge: 2015-07-09 | Disposition: A | Discharge status: Home / Self Care | Payer: Medicaid Other | Source: Ambulatory Visit | Attending: Obstetrics & Gynecology | Admitting: Obstetrics & Gynecology

## 2015-07-09 ENCOUNTER — Encounter (HOSPITAL_COMMUNITY): Payer: Self-pay | Admitting: *Deleted

## 2015-07-09 DIAGNOSIS — O9989 Other specified diseases and conditions complicating pregnancy, childbirth and the puerperium: Secondary | ICD-10-CM | POA: Diagnosis not present

## 2015-07-09 DIAGNOSIS — R1030 Lower abdominal pain, unspecified: Secondary | ICD-10-CM | POA: Diagnosis present

## 2015-07-09 DIAGNOSIS — R109 Unspecified abdominal pain: Secondary | ICD-10-CM

## 2015-07-09 DIAGNOSIS — Z3A33 33 weeks gestation of pregnancy: Secondary | ICD-10-CM | POA: Diagnosis not present

## 2015-07-09 LAB — URINALYSIS, ROUTINE W REFLEX MICROSCOPIC
BILIRUBIN URINE: NEGATIVE
Glucose, UA: NEGATIVE mg/dL
Hgb urine dipstick: NEGATIVE
KETONES UR: NEGATIVE mg/dL
LEUKOCYTES UA: NEGATIVE
NITRITE: NEGATIVE
Protein, ur: NEGATIVE mg/dL
SPECIFIC GRAVITY, URINE: 1.015 (ref 1.005–1.030)
UROBILINOGEN UA: 0.2 mg/dL (ref 0.0–1.0)
pH: 7 (ref 5.0–8.0)

## 2015-07-09 MED ORDER — NIFEDIPINE 10 MG PO CAPS
10.0000 mg | ORAL_CAPSULE | ORAL | Status: DC | PRN
  Administered 2015-07-09: 10 mg via ORAL
  Filled 2015-07-09: qty 1

## 2015-07-09 NOTE — MAU Provider Note (Signed)
History   CSN: [id number]  Arrival date and time: 07/09/15 1905   None     Chief Complaint  Patient presents with  . Abdominal Pain    HPI  Patient is 29 y.o. [id number] [redacted]w[redacted]d here with complaints of cramping in her lower abdomen. She describes it as period cramps that just wont go away. States that they started this morning. She tried to rest throughout th day and thought they were going to subside but have not. Patient localizes pain to lower abdomen.  Unsure if what she is feeling are contractions. She does have abdomen tightening when she gets the cramps. Of note, patient states she works third shift. She has not had anything eo eat or drink all day.   +FM, denies LOF, VB, vaginal discharge.   OB History    Gravida Para Term Preterm AB TAB SAB Ectopic Multiple Living   3 1 1  1   1  1     -Prenatal care at [facility]  Past Medical History  Diagnosis Date  . Ectopic pregnancy 2003    Lapt      Past Surgical History  Procedure Laterality Date  . Laparotomy      Removal of tube    History reviewed. No pertinent family history.  Social History  Substance Use Topics  . Smoking status: Never Smoker   . Smokeless tobacco: Never Used  . Alcohol Use: No    Allergies: No Known Allergies  Prescriptions prior to admission  Medication Sig Dispense Refill Last Dose  . acetaminophen (TYLENOL) 325 MG tablet Take 650 mg by mouth every 6 (six) hours as needed for moderate pain.   07/08/2015 at Unknown time  . Lactobacillus (PROBIOTIC ACIDOPHILUS PO) Take 2 tablets by mouth daily.   07/09/2015 at Unknown time  . OVER THE COUNTER MEDICATION Place 1 application vaginally daily as needed (for a yeast infection).   Past Month at Unknown time  . Prenatal Vit-Fe Fumarate-FA (PRENATAL MULTIVITAMIN) TABS Take 1 tablet by mouth daily at 12 noon.   07/09/2015 at 1500  . [DISCONTINUED] ibuprofen (ADVIL,MOTRIN) 600 MG tablet Take 1 tablet (600 mg total) by mouth every 6 (six) hours as needed for pain.  (Patient not taking: Reported on 07/09/2015) 30 tablet 0 Completed Course at Unknown time    Review of Systems  Constitutional: Negative for fever.  Respiratory: Negative for shortness of breath.   Cardiovascular: Negative for chest pain.  Gastrointestinal: Negative for nausea, vomiting, diarrhea and constipation.  Genitourinary: Negative for dysuria.  Neurological: Negative for headaches.  Also per HPI  Physical Exam  Blood pressure 121/58, pulse 87, temperature 98 F (36.7 C), temperature source Oral, resp. rate 18, last menstrual period 11/15/2014, SpO2 99 %, not currently breastfeeding.  Physical Exam  Constitutional: She appears well-developed and well-nourished. No distress.  Cardiovascular: Normal rate and intact distal pulses.   Respiratory: Effort normal.  GI: Soft. Normal appearance. There is no tenderness.  Gravid  Musculoskeletal: Normal range of motion. She exhibits no edema.  Neurological: She is alert.  Skin: Skin is warm and dry.  Dilation: Fingertip Effacement (%): Thick Station: -3 Exam by:: Doroteo Glassman, DO   MAU Course  Procedures - None  MDM: FHT reassuring and reactive; toco with contractions q4-7 then spaced to rare ctx Procardia 10mg  x1 given - improvement PO hydration UA unremarkable   Assessment and Plan  A: Patient is 29 y.o. G3P1011 [redacted]w[redacted]d reporting abdominal cramping likely secondary to dehydration but cannot rule out early preterm  labor. Patient was having contractions when she first arrived q4-6 minutes. Then contractions became rare and only 1 within the last 30 minutes before discharge after procardia. No cervical changes.    P: Discharge home in stable condition - Reviewed findings and my conclusion - preterm labor precautions dicussed - Handout given - Follow-up with OB provider   Caryl Ada, DO 07/09/2015, 8:29 PM PGY-2, [facility]  CNM attestation:  I have seen and examined this patient; I agree with above  documentation in the resident's note.   Tashaya Ancrum is a 29 y.o. G3P1011 reporting abd cramping all day +FM, denies LOF, VB, vaginal discharge.  PE: BP 99/59 mmHg  Pulse 94  Temp(Src) 98 F (36.7 C) (Oral)  Resp 18  SpO2 99%  LMP 11/15/2014 Gen: calm comfortable, NAD Resp: normal effort, no distress Abd: gravid Cx: exam unchanged on repeat  ROS, labs, PMH reviewed NST reactive   Plan: - preterm labor precautions rev'd - continue routine follow up in [facility]  Cam Hai, CNM 11:46 PM  07/09/2015

## 2015-07-09 NOTE — MAU Note (Signed)
Pt presents complaining of cramps in her lower abdomen that started this morning. Denies bleeding or leaking of fluid. Reports good fetal movement. Pt has not been drinking water today.

## 2015-07-09 NOTE — Discharge Instructions (Signed)
Abdominal Pain During Pregnancy °Belly (abdominal) pain is common during pregnancy. Most of the time, it is not a serious problem. Other times, it can be a sign that something is wrong with the pregnancy. Always tell your doctor if you have belly pain. °HOME CARE °Monitor your belly pain for any changes. The following actions may help you feel better: °· Do not have sex (intercourse) or put anything in your vagina until you feel better. °· Rest until your pain stops. °· Drink clear fluids if you feel sick to your stomach (nauseous). Do not eat solid food until you feel better. °· Only take medicine as told by your doctor. °· Keep all doctor visits as told. °GET HELP RIGHT AWAY IF:  °· You are bleeding, leaking fluid, or pieces of tissue come out of your vagina. °· You have more pain or cramping. °· You keep throwing up (vomiting). °· You have pain when you pee (urinate) or have blood in your pee. °· You have a fever. °· You do not feel your baby moving as much. °· You feel very weak or feel like passing out. °· You have trouble breathing, with or without belly pain. °· You have a very bad headache and belly pain. °· You have fluid leaking from your vagina and belly pain. °· You keep having watery poop (diarrhea). °· Your belly pain does not go away after resting, or the pain gets worse. °MAKE SURE YOU:  °· Understand these instructions. °· Will watch your condition. °· Will get help right away if you are not doing well or get worse. °Document Released: 10/20/2009 Document Revised: 07/04/2013 Document Reviewed: 05/31/2013 °ExitCare® Patient Information ©2015 [employer]. This information is not intended to replace advice given to you by your health care provider. Make sure you discuss any questions you have with your health care provider. °Preterm Labor Information °Preterm labor is when labor starts before you are [redacted] weeks pregnant. The normal length of pregnancy is 39 to 41 weeks.  °CAUSES  °The cause of preterm  labor is not often known. The most common known cause is infection. °RISK FACTORS °· Having a history of preterm labor. °· Having your water break before it should. °· Having a placenta that covers the opening of the cervix. °· Having a placenta that breaks away from the uterus. °· Having a cervix that is too weak to hold the baby in the uterus. °· Having too much fluid in the amniotic sac. °· Taking drugs or smoking while pregnant. °· Not gaining enough weight while pregnant. °· Being younger than 18 and older than 28 years old. °· Having a low income. °· Being African American. °SYMPTOMS °· Period-like cramps, belly (abdominal) pain, or back pain. °· Contractions that are regular, as often as six in an hour. They may be mild or painful. °· Contractions that start at the top of the belly. They then move to the lower belly and back. °· Lower belly pressure that seems to get stronger. °· Bleeding from the vagina. °· Fluid leaking from the vagina. °TREATMENT  °Treatment depends on: °· Your condition. °· The condition of your baby. °· How many weeks pregnant you are. °Your doctor may have you: °· Take medicine to stop contractions. °· Stay in bed except to use the restroom (bed rest). °· Stay in the hospital. °WHAT SHOULD YOU DO IF YOU THINK YOU ARE IN PRETERM LABOR? °Call your doctor right away. You need to go to the hospital right away.  °  HOW CAN YOU PREVENT PRETERM LABOR IN FUTURE PREGNANCIES? °· Stop smoking, if you smoke. °· Maintain healthy weight gain. °· Do not take drugs or be around chemicals that are not needed. °· Tell your doctor if you think you have an infection. °· Tell your doctor if you had a preterm labor before. °Document Released: 01/28/2009 Document Revised: 08/22/2013 Document Reviewed: 01/28/2009 °ExitCare® Patient Information ©2015 [employer]. This information is not intended to replace advice given to you by your health care provider. Make sure you discuss any questions you have with your  health care provider. ° °

## 2015-07-22 ENCOUNTER — Ambulatory Visit (HOSPITAL_COMMUNITY)
Admission: RE | Admit: 2015-07-22 | Discharge: 2015-07-22 | Disposition: A | Discharge status: Home / Self Care | Payer: Medicaid Other | Source: Ambulatory Visit | Attending: Advanced Practice Midwife | Admitting: Advanced Practice Midwife

## 2015-07-22 ENCOUNTER — Other Ambulatory Visit: Payer: Self-pay | Admitting: Advanced Practice Midwife

## 2015-07-22 DIAGNOSIS — Z0489 Encounter for examination and observation for other specified reasons: Secondary | ICD-10-CM

## 2015-07-22 DIAGNOSIS — IMO0002 Reserved for concepts with insufficient information to code with codable children: Secondary | ICD-10-CM

## 2015-07-22 DIAGNOSIS — Z3490 Encounter for supervision of normal pregnancy, unspecified, unspecified trimester: Secondary | ICD-10-CM

## 2015-07-22 DIAGNOSIS — O09293 Supervision of pregnancy with other poor reproductive or obstetric history, third trimester: Secondary | ICD-10-CM | POA: Insufficient documentation

## 2015-07-22 DIAGNOSIS — Z3A34 34 weeks gestation of pregnancy: Secondary | ICD-10-CM | POA: Insufficient documentation

## 2015-07-22 DIAGNOSIS — O09292 Supervision of pregnancy with other poor reproductive or obstetric history, second trimester: Secondary | ICD-10-CM

## 2015-07-22 DIAGNOSIS — Z36 Encounter for antenatal screening of mother: Secondary | ICD-10-CM | POA: Diagnosis not present

## 2015-07-23 ENCOUNTER — Encounter: Payer: Self-pay | Admitting: *Deleted

## 2015-07-23 ENCOUNTER — Ambulatory Visit (INDEPENDENT_AMBULATORY_CARE_PROVIDER_SITE_OTHER): Payer: Medicaid Other | Admitting: Obstetrics & Gynecology

## 2015-07-23 VITALS — BP 128/74 | HR 100 | Wt 175.0 lb

## 2015-07-23 DIAGNOSIS — Z3483 Encounter for supervision of other normal pregnancy, third trimester: Secondary | ICD-10-CM

## 2015-07-23 DIAGNOSIS — O09292 Supervision of pregnancy with other poor reproductive or obstetric history, second trimester: Secondary | ICD-10-CM

## 2015-07-23 DIAGNOSIS — Z3493 Encounter for supervision of normal pregnancy, unspecified, third trimester: Secondary | ICD-10-CM

## 2015-07-23 NOTE — Progress Notes (Signed)
Subjective:  Monique Hickman is a 29 y.o. AA G3P1011 at [redacted]w[redacted]d being seen today for ongoing prenatal care.  Patient reports no complaints.  Contractions: Not present.  Vag. Bleeding: None. Movement: Present. Denies leaking of fluid.   The following portions of the patient's history were reviewed and updated as appropriate: allergies, current medications, past family history, past medical history, past social history, past surgical history and problem list.   Objective:   Filed Vitals:   07/23/15 1014  BP: 128/74  Pulse: 100  Weight: 175 lb (79.379 kg)    Fetal Status: Fetal Heart Rate (bpm): 146 Fundal Height: 36 cm Movement: Present     General:  Alert, oriented and cooperative. Patient is in no acute distress.  Skin: Skin is warm and dry. No rash noted.   Cardiovascular: Normal heart rate noted  Respiratory: Normal respiratory effort, no problems with respiration noted  Abdomen: Soft, gravid, appropriate for gestational age. Pain/Pressure: Absent     Pelvic: Vag. Bleeding: None Vag D/C Character: Thin   Cervical exam deferred        Extremities: Normal range of motion.  Edema: None  Mental Status: Normal mood and affect. Normal behavior. Normal judgment and thought content.   Urinalysis: Urine Protein: Trace Urine Glucose: Negative  Assessment and Plan:  Pregnancy: G3P1011 at [redacted]w[redacted]d  1. History of shoulder dystocia in prior pregnancy, currently pregnant in second trimester   2. Supervision of low-risk pregnancy, third trimester  - Cervical cultures at next visit  Preterm labor symptoms and general obstetric precautions including but not limited to vaginal bleeding, contractions, leaking of fluid and fetal movement were reviewed in detail with the patient. Please refer to After Visit Summary for other counseling recommendations.  Return in about 1 week (around 07/30/2015) for cervical cultures.   Allie Bossier, MD

## 2015-07-30 ENCOUNTER — Ambulatory Visit (INDEPENDENT_AMBULATORY_CARE_PROVIDER_SITE_OTHER): Payer: Medicaid Other | Admitting: Obstetrics & Gynecology

## 2015-07-30 VITALS — BP 116/70 | HR 90 | Wt 178.0 lb

## 2015-07-30 DIAGNOSIS — Z3490 Encounter for supervision of normal pregnancy, unspecified, unspecified trimester: Secondary | ICD-10-CM

## 2015-07-30 DIAGNOSIS — Z36 Encounter for antenatal screening of mother: Secondary | ICD-10-CM | POA: Diagnosis not present

## 2015-07-30 DIAGNOSIS — Z3483 Encounter for supervision of other normal pregnancy, third trimester: Secondary | ICD-10-CM

## 2015-07-30 DIAGNOSIS — Z113 Encounter for screening for infections with a predominantly sexual mode of transmission: Secondary | ICD-10-CM

## 2015-07-30 DIAGNOSIS — Z3685 Encounter for antenatal screening for Streptococcus B: Secondary | ICD-10-CM

## 2015-07-30 NOTE — Progress Notes (Signed)
Has noticed discharge with itching.

## 2015-07-30 NOTE — Progress Notes (Signed)
Subjective:  Monique Hickman is a 29 y.o. G3P1011 at [redacted]w[redacted]d being seen today for ongoing prenatal care.  Patient reports no complaints.  Contractions: Irregular.  Vag. Bleeding: None. Movement: Present. Denies leaking of fluid.   The following portions of the patient's history were reviewed and updated as appropriate: allergies, current medications, past family history, past medical history, past social history, past surgical history and problem list.   Objective:   Filed Vitals:   07/30/15 1151  BP: 116/70  Pulse: 90  Weight: 178 lb (80.74 kg)    Fetal Status: Fetal Heart Rate (bpm): 138   Movement: Present     General:  Alert, oriented and cooperative. Patient is in no acute distress.  Skin: Skin is warm and dry. No rash noted.   Cardiovascular: Normal heart rate noted  Respiratory: Normal respiratory effort, no problems with respiration noted  Abdomen: Soft, gravid, appropriate for gestational age. Pain/Pressure: Present     Pelvic: Vag. Bleeding: None Vag D/C Character: Thick   Cervical exam performed        Extremities: Normal range of motion.  Edema: None  Mental Status: Normal mood and affect. Normal behavior. Normal judgment and thought content.   Urinalysis: Urine Protein: Trace Urine Glucose: Negative  Assessment and Plan:  Pregnancy: G3P1011 at [redacted]w[redacted]d  1. Antenatal screening for streptococcus B 1.5/th/-3 - Culture, beta strep (group b only)  2. Screening for venereal disease (VD) Routine prenatal care - GC/chlamydia probe amp, urine   Term labor symptoms and general obstetric precautions including but not limited to vaginal bleeding, contractions, leaking of fluid and fetal movement were reviewed in detail with the patient. Please refer to After Visit Summary for other counseling recommendations.  No Follow-up on file.   Lesly Dukes, MD

## 2015-07-30 NOTE — Patient Instructions (Signed)
Levonorgestrel intrauterine device (IUD) What is this medicine? LEVONORGESTREL IUD (LEE voe nor jes trel) is a contraceptive (birth control) device. The device is placed inside the uterus by a healthcare professional. It is used to prevent pregnancy and can also be used to treat heavy bleeding that occurs during your period. Depending on the device, it can be used for 3 to 5 years. This medicine may be used for other purposes; ask your health care provider or pharmacist if you have questions. COMMON BRAND NAME(S): LILETTA, Mirena, Skyla What should I tell my health care provider before I take this medicine? They need to know if you have any of these conditions: -abnormal Pap smear -cancer of the breast, uterus, or cervix -diabetes -endometritis -genital or pelvic infection now or in the past -have more than one sexual partner or your partner has more than one partner -heart disease -history of an ectopic or tubal pregnancy -immune system problems -IUD in place -liver disease or tumor -problems with blood clots or take blood-thinners -use intravenous drugs -uterus of unusual shape -vaginal bleeding that has not been explained -an unusual or allergic reaction to levonorgestrel, other hormones, silicone, or polyethylene, medicines, foods, dyes, or preservatives -pregnant or trying to get pregnant -breast-feeding How should I use this medicine? This device is placed inside the uterus by a health care professional. Talk to your pediatrician regarding the use of this medicine in children. Special care may be needed. Overdosage: If you think you have taken too much of this medicine contact a poison control center or emergency room at once. NOTE: This medicine is only for you. Do not share this medicine with others. What if I miss a dose? This does not apply. What may interact with this medicine? Do not take this medicine with any of the following  medications: -amprenavir -bosentan -fosamprenavir This medicine may also interact with the following medications: -aprepitant -barbiturate medicines for inducing sleep or treating seizures -bexarotene -griseofulvin -medicines to treat seizures like carbamazepine, ethotoin, felbamate, oxcarbazepine, phenytoin, topiramate -modafinil -pioglitazone -rifabutin -rifampin -rifapentine -some medicines to treat HIV infection like atazanavir, indinavir, lopinavir, nelfinavir, tipranavir, ritonavir -St. John's wort -warfarin This list may not describe all possible interactions. Give your health care provider a list of all the medicines, herbs, non-prescription drugs, or dietary supplements you use. Also tell them if you smoke, drink alcohol, or use illegal drugs. Some items may interact with your medicine. What should I watch for while using this medicine? Visit your doctor or health care professional for regular check ups. See your doctor if you or your partner has sexual contact with others, becomes HIV positive, or gets a sexual transmitted disease. This product does not protect you against HIV infection (AIDS) or other sexually transmitted diseases. You can check the placement of the IUD yourself by reaching up to the top of your vagina with clean fingers to feel the threads. Do not pull on the threads. It is a good habit to check placement after each menstrual period. Call your doctor right away if you feel more of the IUD than just the threads or if you cannot feel the threads at all. The IUD may come out by itself. You may become pregnant if the device comes out. If you notice that the IUD has come out use a backup birth control method like condoms and call your health care provider. Using tampons will not change the position of the IUD and are okay to use during your period. What side effects may   I notice from receiving this medicine? Side effects that you should report to your doctor or  health care professional as soon as possible: -allergic reactions like skin rash, itching or hives, swelling of the face, lips, or tongue -fever, flu-like symptoms -genital sores -high blood pressure -no menstrual period for 6 weeks during use -pain, swelling, warmth in the leg -pelvic pain or tenderness -severe or sudden headache -signs of pregnancy -stomach cramping -sudden shortness of breath -trouble with balance, talking, or walking -unusual vaginal bleeding, discharge -yellowing of the eyes or skin Side effects that usually do not require medical attention (report to your doctor or health care professional if they continue or are bothersome): -acne -breast pain -change in sex drive or performance -changes in weight -cramping, dizziness, or faintness while the device is being inserted -headache -irregular menstrual bleeding within first 3 to 6 months of use -nausea This list may not describe all possible side effects. Call your doctor for medical advice about side effects. You may report side effects to FDA at 1-800-FDA-1088. Where should I keep my medicine? This does not apply. NOTE: This sheet is a summary. It may not cover all possible information. If you have questions about this medicine, talk to your doctor, pharmacist, or health care provider.  2015, Elsevier/Gold Standard. (2011-12-02 13:54:04)  

## 2015-07-31 LAB — GC/CHLAMYDIA PROBE AMP, URINE
CHLAMYDIA, SWAB/URINE, PCR: NEGATIVE
GC Probe Amp, Urine: NEGATIVE

## 2015-08-01 LAB — CULTURE, BETA STREP (GROUP B ONLY)

## 2015-08-05 ENCOUNTER — Other Ambulatory Visit (INDEPENDENT_AMBULATORY_CARE_PROVIDER_SITE_OTHER): Payer: Medicaid Other | Admitting: *Deleted

## 2015-08-05 VITALS — BP 128/78 | HR 87 | Resp 16 | Wt 178.0 lb

## 2015-08-05 DIAGNOSIS — Z3493 Encounter for supervision of normal pregnancy, unspecified, third trimester: Secondary | ICD-10-CM

## 2015-08-05 DIAGNOSIS — Z36 Encounter for antenatal screening of mother: Secondary | ICD-10-CM

## 2015-08-05 NOTE — Progress Notes (Signed)
Pt here for 36 week prenatal labs - GC/Chlamydia and GBS cultures.  Edema:None Movement:Yes Pain/Pressure:Yes Contractions:Irregular Bleeding:None Vaginal Discharge:Thick Urine Protein:Trace Urine Glucose:Neg

## 2015-08-06 LAB — GC/CHLAMYDIA PROBE AMP
CT Probe RNA: NEGATIVE
GC Probe RNA: NEGATIVE

## 2015-08-07 LAB — CULTURE, BETA STREP (GROUP B ONLY)

## 2015-08-13 ENCOUNTER — Ambulatory Visit (INDEPENDENT_AMBULATORY_CARE_PROVIDER_SITE_OTHER): Payer: Medicaid Other | Admitting: Obstetrics & Gynecology

## 2015-08-13 VITALS — BP 116/65 | HR 91 | Wt 177.0 lb

## 2015-08-13 DIAGNOSIS — O09292 Supervision of pregnancy with other poor reproductive or obstetric history, second trimester: Secondary | ICD-10-CM

## 2015-08-13 DIAGNOSIS — Z3493 Encounter for supervision of normal pregnancy, unspecified, third trimester: Secondary | ICD-10-CM

## 2015-08-13 DIAGNOSIS — Z3483 Encounter for supervision of other normal pregnancy, third trimester: Secondary | ICD-10-CM

## 2015-08-13 NOTE — Progress Notes (Signed)
Subjective:  Monique Hickman is a 29 y.o. G3P1011 at [redacted]w[redacted]d being seen today for ongoing prenatal care.  Patient reports backache and occasional contractions.  Contractions: Irritability.  Vag. Bleeding: None. Movement: Present. Denies leaking of fluid.   The following portions of the patient's history were reviewed and updated as appropriate: allergies, current medications, past family history, past medical history, past social history, past surgical history and problem list.   Objective:   Filed Vitals:   08/13/15 1026  BP: 116/65  Pulse: 91  Weight: 177 lb (80.287 kg)    Fetal Status: Fetal Heart Rate (bpm): 133 Fundal Height: 39 cm Movement: Present     General:  Alert, oriented and cooperative. Patient is in no acute distress.  Skin: Skin is warm and dry. No rash noted.   Cardiovascular: Normal heart rate noted  Respiratory: Normal respiratory effort, no problems with respiration noted  Abdomen: Soft, gravid, appropriate for gestational age. Pain/Pressure: Present     Pelvic: Vag. Bleeding: None Vag D/C Character: Thin   Cervical exam deferred        Extremities: Normal range of motion.  Edema: None  Mental Status: Normal mood and affect. Normal behavior. Normal judgment and thought content.   Urinalysis: Urine Protein: 1+ Urine Glucose: Negative  Assessment and Plan:  Pregnancy: G3P1011 at [redacted]w[redacted]d  1. Supervision of low-risk pregnancy, third trimester Labor precautions reviewed. reviewed GBS and cervical cx results from last visit  2. History of shoulder dystocia in prior pregnancy, currently pregnant in second trimester sono 9/8 reveals fetus in 70%ile.  Term labor symptoms and general obstetric precautions including but not limited to vaginal bleeding, contractions, leaking of fluid and fetal movement were reviewed in detail with the patient. Please refer to After Visit Summary for other counseling recommendations.  Return in about 1 week (around 08/20/2015).   Willodean Rosenthal, MD

## 2015-08-13 NOTE — Patient Instructions (Signed)
Third Trimester of Pregnancy The third trimester is from week 29 through week 42, months 7 through 9. The third trimester is a time when the fetus is growing rapidly. At the end of the ninth month, the fetus is about 20 inches in length and weighs 6-10 pounds.  BODY CHANGES Your body goes through many changes during pregnancy. The changes vary from woman to woman.   Your weight will continue to increase. You can expect to gain 25-35 pounds (11-16 kg) by the end of the pregnancy.  You may begin to get stretch marks on your hips, abdomen, and breasts.  You may urinate more often because the fetus is moving lower into your pelvis and pressing on your bladder.  You may develop or continue to have heartburn as a result of your pregnancy.  You may develop constipation because certain hormones are causing the muscles that push waste through your intestines to slow down.  You may develop hemorrhoids or swollen, bulging veins (varicose veins).  You may have pelvic pain because of the weight gain and pregnancy hormones relaxing your joints between the bones in your pelvis. Backaches may result from overexertion of the muscles supporting your posture.  You may have changes in your hair. These can include thickening of your hair, rapid growth, and changes in texture. Some women also have hair loss during or after pregnancy, or hair that feels dry or thin. Your hair will most likely return to normal after your baby is born.  Your breasts will continue to grow and be tender. A yellow discharge may leak from your breasts called colostrum.  Your belly button may stick out.  You may feel short of breath because of your expanding uterus.  You may notice the fetus "dropping," or moving lower in your abdomen.  You may have a bloody mucus discharge. This usually occurs a few days to a week before labor begins.  Your cervix becomes thin and soft (effaced) near your due date. WHAT TO EXPECT AT YOUR PRENATAL  EXAMS  You will have prenatal exams every 2 weeks until week 36. Then, you will have weekly prenatal exams. During a routine prenatal visit:  You will be weighed to make sure you and the fetus are growing normally.  Your blood pressure is taken.  Your abdomen will be measured to track your baby's growth.  The fetal heartbeat will be listened to.  Any test results from the previous visit will be discussed.  You may have a cervical check near your due date to see if you have effaced. At around 36 weeks, your caregiver will check your cervix. At the same time, your caregiver will also perform a test on the secretions of the vaginal tissue. This test is to determine if a type of bacteria, Group B streptococcus, is present. Your caregiver will explain this further. Your caregiver may ask you:  What your birth plan is.  How you are feeling.  If you are feeling the baby move.  If you have had any abnormal symptoms, such as leaking fluid, bleeding, severe headaches, or abdominal cramping.  If you have any questions. Other tests or screenings that may be performed during your third trimester include:  Blood tests that check for low iron levels (anemia).  Fetal testing to check the health, activity level, and growth of the fetus. Testing is done if you have certain medical conditions or if there are problems during the pregnancy. FALSE LABOR You may feel small, irregular contractions that   eventually go away. These are called Braxton Hicks contractions, or false labor. Contractions may last for hours, days, or even weeks before true labor sets in. If contractions come at regular intervals, intensify, or become painful, it is best to be seen by your caregiver.  SIGNS OF LABOR   Menstrual-like cramps.  Contractions that are 5 minutes apart or less.  Contractions that start on the top of the uterus and spread down to the lower abdomen and back.  A sense of increased pelvic pressure or back  pain.  A watery or bloody mucus discharge that comes from the vagina. If you have any of these signs before the 37th week of pregnancy, call your caregiver right away. You need to go to the hospital to get checked immediately. HOME CARE INSTRUCTIONS   Avoid all smoking, herbs, alcohol, and unprescribed drugs. These chemicals affect the formation and growth of the baby.  Follow your caregiver's instructions regarding medicine use. There are medicines that are either safe or unsafe to take during pregnancy.  Exercise only as directed by your caregiver. Experiencing uterine cramps is a good sign to stop exercising.  Continue to eat regular, healthy meals.  Wear a good support bra for breast tenderness.  Do not use hot tubs, steam rooms, or saunas.  Wear your seat belt at all times when driving.  Avoid raw meat, uncooked cheese, cat litter boxes, and soil used by cats. These carry germs that can cause birth defects in the baby.  Take your prenatal vitamins.  Try taking a stool softener (if your caregiver approves) if you develop constipation. Eat more high-fiber foods, such as fresh vegetables or fruit and whole grains. Drink plenty of fluids to keep your urine clear or pale yellow.  Take warm sitz baths to soothe any pain or discomfort caused by hemorrhoids. Use hemorrhoid cream if your caregiver approves.  If you develop varicose veins, wear support hose. Elevate your feet for 15 minutes, 3-4 times a day. Limit salt in your diet.  Avoid heavy lifting, wear low heal shoes, and practice good posture.  Rest a lot with your legs elevated if you have leg cramps or low back pain.  Visit your dentist if you have not gone during your pregnancy. Use a soft toothbrush to brush your teeth and be gentle when you floss.  A sexual relationship may be continued unless your caregiver directs you otherwise.  Do not travel far distances unless it is absolutely necessary and only with the approval  of your caregiver.  Take prenatal classes to understand, practice, and ask questions about the labor and delivery.  Make a trial run to the hospital.  Pack your hospital bag.  Prepare the baby's nursery.  Continue to go to all your prenatal visits as directed by your caregiver. SEEK MEDICAL CARE IF:  You are unsure if you are in labor or if your water has broken.  You have dizziness.  You have mild pelvic cramps, pelvic pressure, or nagging pain in your abdominal area.  You have persistent nausea, vomiting, or diarrhea.  You have a bad smelling vaginal discharge.  You have pain with urination. SEEK IMMEDIATE MEDICAL CARE IF:   You have a fever.  You are leaking fluid from your vagina.  You have spotting or bleeding from your vagina.  You have severe abdominal cramping or pain.  You have rapid weight loss or gain.  You have shortness of breath with chest pain.  You notice sudden or extreme swelling   of your face, hands, ankles, feet, or legs.  You have not felt your baby move in over an hour.  You have severe headaches that do not go away with medicine.  You have vision changes. Document Released: 10/26/2001 Document Revised: 11/06/2013 Document Reviewed: 01/02/2013 [employer] Patient Information 2015 [employer]. This information is not intended to replace advice given to you by your health care provider. Make sure you discuss any questions you have with your health care provider.  

## 2015-08-20 ENCOUNTER — Inpatient Hospital Stay (HOSPITAL_COMMUNITY)
Admission: AD | Admit: 2015-08-20 | Discharge: 2015-08-20 | Disposition: A | Discharge status: Home / Self Care | Payer: Medicaid Other | Source: Ambulatory Visit | Attending: Obstetrics & Gynecology | Admitting: Obstetrics & Gynecology

## 2015-08-20 ENCOUNTER — Ambulatory Visit (INDEPENDENT_AMBULATORY_CARE_PROVIDER_SITE_OTHER): Payer: Self-pay | Admitting: Obstetrics & Gynecology

## 2015-08-20 ENCOUNTER — Encounter (HOSPITAL_COMMUNITY): Payer: Self-pay

## 2015-08-20 VITALS — BP 113/62 | HR 101 | Wt 176.0 lb

## 2015-08-20 DIAGNOSIS — Z3493 Encounter for supervision of normal pregnancy, unspecified, third trimester: Secondary | ICD-10-CM | POA: Diagnosis present

## 2015-08-20 DIAGNOSIS — Z3483 Encounter for supervision of other normal pregnancy, third trimester: Secondary | ICD-10-CM

## 2015-08-20 NOTE — Discharge Instructions (Signed)
Braxton Hicks Contractions °Contractions of the uterus can occur throughout pregnancy. Contractions are not always a sign that you are in labor.  °WHAT ARE BRAXTON HICKS CONTRACTIONS?  °Contractions that occur before labor are called Braxton Hicks contractions, or false labor. Toward the end of pregnancy (32-34 weeks), these contractions can develop more often and may become more forceful. This is not true labor because these contractions do not result in opening (dilatation) and thinning of the cervix. They are sometimes difficult to tell apart from true labor because these contractions can be forceful and people have different pain tolerances. You should not feel embarrassed if you go to the hospital with false labor. Sometimes, the only way to tell if you are in true labor is for your health care provider to look for changes in the cervix. °If there are no prenatal problems or other health problems associated with the pregnancy, it is completely safe to be sent home with false labor and await the onset of true labor. °HOW CAN YOU TELL THE DIFFERENCE BETWEEN TRUE AND FALSE LABOR? °False Labor °· The contractions of false labor are usually shorter and not as hard as those of true labor.   °· The contractions are usually irregular.   °· The contractions are often felt in the front of the lower abdomen and in the groin.   °· The contractions may go away when you walk around or change positions while lying down.   °· The contractions get weaker and are shorter lasting as time goes on.   °· The contractions do not usually become progressively stronger, regular, and closer together as with true labor.   °True Labor °1. Contractions in true labor last 30-70 seconds, become very regular, usually become more intense, and increase in frequency.   °2. The contractions do not go away with walking.   °3. The discomfort is usually felt in the top of the uterus and spreads to the lower abdomen and low back.   °4. True labor can  be determined by your health care provider with an exam. This will show that the cervix is dilating and getting thinner.   °WHAT TO REMEMBER °· Keep up with your usual exercises and follow other instructions given by your health care provider.   °· Take medicines as directed by your health care provider.   °· Keep your regular prenatal appointments.   °· Eat and drink lightly if you think you are going into labor.   °· If Braxton Hicks contractions are making you uncomfortable:   °· Change your position from lying down or resting to walking, or from walking to resting.   °· Sit and rest in a tub of warm water.   °· Drink 2-3 glasses of water. Dehydration may cause these contractions.   °· Do slow and deep breathing several times an hour.   °WHEN SHOULD I SEEK IMMEDIATE MEDICAL CARE? °Seek immediate medical care if: °· Your contractions become stronger, more regular, and closer together.   °· You have fluid leaking or gushing from your vagina.   °· You have a fever.   °· You pass blood-tinged mucus.   °· You have vaginal bleeding.   °· You have continuous abdominal pain.   °· You have low back pain that you never had before.   °· You feel your baby's head pushing down and causing pelvic pressure.   °· Your baby is not moving as much as it used to.   °  °This information is not intended to replace advice given to you by your health care provider. Make sure you discuss any questions you have with your health care   provider. °  °Document Released: 11/01/2005 Document Revised: 11/06/2013 Document Reviewed: 08/13/2013 °[employer] Interactive Patient Education ©2016 [employer] ° °Fetal Movement Counts °Patient Name: __________________________________________________ Patient Due Date: ____________________ °Performing a fetal movement count is highly recommended in high-risk pregnancies, but it is good for every pregnant woman to do. Your health care provider may ask you to start counting fetal movements at 28 weeks of the  pregnancy. Fetal movements often increase: °· After eating a full meal. °· After physical activity. °· After eating or drinking something sweet or cold. °· At rest. °Pay attention to when you feel the baby is most active. This will help you notice a pattern of your baby's sleep and wake cycles and what factors contribute to an increase in fetal movement. It is important to perform a fetal movement count at the same time each day when your baby is normally most active.  °HOW TO COUNT FETAL MOVEMENTS °5. Find a quiet and comfortable area to sit or lie down on your left side. Lying on your left side provides the best blood and oxygen circulation to your baby. °6. Write down the day and time on a sheet of paper or in a journal. °7. Start counting kicks, flutters, swishes, rolls, or jabs in a 2-hour period. You should feel at least 10 movements within 2 hours. °8. If you do not feel 10 movements in 2 hours, wait 2-3 hours and count again. Look for a change in the pattern or not enough counts in 2 hours. °SEEK MEDICAL CARE IF: °· You feel less than 10 counts in 2 hours, tried twice. °· There is no movement in over an hour. °· The pattern is changing or taking longer each day to reach 10 counts in 2 hours. °· You feel the baby is not moving as he or she usually does. °Date: ____________ Movements: ____________ Start time: ____________ Finish time: ____________  °Date: ____________ Movements: ____________ Start time: ____________ Finish time: ____________ °Date: ____________ Movements: ____________ Start time: ____________ Finish time: ____________ °Date: ____________ Movements: ____________ Start time: ____________ Finish time: ____________ °Date: ____________ Movements: ____________ Start time: ____________ Finish time: ____________ °Date: ____________ Movements: ____________ Start time: ____________ Finish time: ____________ °Date: ____________ Movements: ____________ Start time: ____________ Finish time:  ____________ °Date: ____________ Movements: ____________ Start time: ____________ Finish time: ____________  °Date: ____________ Movements: ____________ Start time: ____________ Finish time: ____________ °Date: ____________ Movements: ____________ Start time: ____________ Finish time: ____________ °Date: ____________ Movements: ____________ Start time: ____________ Finish time: ____________ °Date: ____________ Movements: ____________ Start time: ____________ Finish time: ____________ °Date: ____________ Movements: ____________ Start time: ____________ Finish time: ____________ °Date: ____________ Movements: ____________ Start time: ____________ Finish time: ____________ °Date: ____________ Movements: ____________ Start time: ____________ Finish time: ____________  °Date: ____________ Movements: ____________ Start time: ____________ Finish time: ____________ °Date: ____________ Movements: ____________ Start time: ____________ Finish time: ____________ °Date: ____________ Movements: ____________ Start time: ____________ Finish time: ____________ °Date: ____________ Movements: ____________ Start time: ____________ Finish time: ____________ °Date: ____________ Movements: ____________ Start time: ____________ Finish time: ____________ °Date: ____________ Movements: ____________ Start time: ____________ Finish time: ____________ °Date: ____________ Movements: ____________ Start time: ____________ Finish time: ____________  °Date: ____________ Movements: ____________ Start time: ____________ Finish time: ____________ °Date: ____________ Movements: ____________ Start time: ____________ Finish time: ____________ °Date: ____________ Movements: ____________ Start time: ____________ Finish time: ____________ °Date: ____________ Movements: ____________ Start time: ____________ Finish time: ____________ °Date: ____________ Movements: ____________ Start time: ____________ Finish time: ____________ °Date: ____________ Movements:  ____________ Start time: ____________ Finish   time: ____________ °Date: ____________ Movements: ____________ Start time: ____________ Finish time: ____________  °Date: ____________ Movements: ____________ Start time: ____________ Finish time: ____________ °Date: ____________ Movements: ____________ Start time: ____________ Finish time: ____________ °Date: ____________ Movements: ____________ Start time: ____________ Finish time: ____________ °Date: ____________ Movements: ____________ Start time: ____________ Finish time: ____________ °Date: ____________ Movements: ____________ Start time: ____________ Finish time: ____________ °Date: ____________ Movements: ____________ Start time: ____________ Finish time: ____________ °Date: ____________ Movements: ____________ Start time: ____________ Finish time: ____________  °Date: ____________ Movements: ____________ Start time: ____________ Finish time: ____________ °Date: ____________ Movements: ____________ Start time: ____________ Finish time: ____________ °Date: ____________ Movements: ____________ Start time: ____________ Finish time: ____________ °Date: ____________ Movements: ____________ Start time: ____________ Finish time: ____________ °Date: ____________ Movements: ____________ Start time: ____________ Finish time: ____________ °Date: ____________ Movements: ____________ Start time: ____________ Finish time: ____________ °Date: ____________ Movements: ____________ Start time: ____________ Finish time: ____________  °Date: ____________ Movements: ____________ Start time: ____________ Finish time: ____________ °Date: ____________ Movements: ____________ Start time: ____________ Finish time: ____________ °Date: ____________ Movements: ____________ Start time: ____________ Finish time: ____________ °Date: ____________ Movements: ____________ Start time: ____________ Finish time: ____________ °Date: ____________ Movements: ____________ Start time: ____________ Finish  time: ____________ °Date: ____________ Movements: ____________ Start time: ____________ Finish time: ____________ °Date: ____________ Movements: ____________ Start time: ____________ Finish time: ____________  °Date: ____________ Movements: ____________ Start time: ____________ Finish time: ____________ °Date: ____________ Movements: ____________ Start time: ____________ Finish time: ____________ °Date: ____________ Movements: ____________ Start time: ____________ Finish time: ____________ °Date: ____________ Movements: ____________ Start time: ____________ Finish time: ____________ °Date: ____________ Movements: ____________ Start time: ____________ Finish time: ____________ °Date: ____________ Movements: ____________ Start time: ____________ Finish time: ____________ °  °This information is not intended to replace advice given to you by your health care provider. Make sure you discuss any questions you have with your health care provider. °  °Document Released: 12/01/2006 Document Revised: 11/22/2014 Document Reviewed: 08/28/2012 °Elsevier Interactive Patient Education ©2016 [employer] ° °

## 2015-08-20 NOTE — Progress Notes (Signed)
Subjective:  Monique Hickman is a 29 y.o. AA  G3P1011 at [redacted]w[redacted]d being seen today for ongoing prenatal care.  Patient reports no complaints.  Contractions: Irregular.  Vag. Bleeding: None. Movement: Present. Denies leaking of fluid.   The following portions of the patient's history were reviewed and updated as appropriate: allergies, current medications, past family history, past medical history, past social history, past surgical history and problem list.   Objective:   Filed Vitals:   08/20/15 1042  BP: 113/62  Pulse: 101  Weight: 176 lb (79.833 kg)    Fetal Status:     Movement: Present     General:  Alert, oriented and cooperative. Patient is in no acute distress.  Skin: Skin is warm and dry. No rash noted.   Cardiovascular: Normal heart rate noted  Respiratory: Normal respiratory effort, no problems with respiration noted  Abdomen: Soft, gravid, appropriate for gestational age. Pain/Pressure: Present     Pelvic: Vag. Bleeding: None Vag D/C Character: White   Cervical exam performed        Extremities: Normal range of motion.  Edema: Trace  Mental Status: Normal mood and affect. Normal behavior. Normal judgment and thought content.   Urinalysis: Urine Protein: 1+ Urine Glucose: Negative  Assessment and Plan:  Pregnancy: G3P1011 at [redacted]w[redacted]d  1. Supervision of low-risk pregnancy, third trimester   Term labor symptoms and general obstetric precautions including but not limited to vaginal bleeding, contractions, leaking of fluid and fetal movement were reviewed in detail with the patient. Please refer to After Visit Summary for other counseling recommendations.  Return in about 1 week (around 08/27/2015).   Allie Bossier, MD

## 2015-08-20 NOTE — MAU Note (Signed)
contractions 

## 2015-08-20 NOTE — Progress Notes (Signed)
Pt states she has had nausea and vomiting twice since last seen in office. Pt wants cervical check today.

## 2015-08-27 ENCOUNTER — Ambulatory Visit (INDEPENDENT_AMBULATORY_CARE_PROVIDER_SITE_OTHER): Payer: Medicaid Other | Admitting: Obstetrics & Gynecology

## 2015-08-27 VITALS — BP 116/71 | HR 95 | Wt 180.0 lb

## 2015-08-27 DIAGNOSIS — B3731 Acute candidiasis of vulva and vagina: Secondary | ICD-10-CM

## 2015-08-27 DIAGNOSIS — O09292 Supervision of pregnancy with other poor reproductive or obstetric history, second trimester: Secondary | ICD-10-CM

## 2015-08-27 DIAGNOSIS — B373 Candidiasis of vulva and vagina: Secondary | ICD-10-CM

## 2015-08-27 MED ORDER — FLUCONAZOLE 150 MG PO TABS: 150.0000 mg | ORAL_TABLET | Freq: Once | ORAL | Status: DC

## 2015-08-27 NOTE — Progress Notes (Signed)
Subjective:  Monique Hickman is a 29 y.o. G3P1011 at 3127w0d being seen today for ongoing prenatal care.  Patient reports Thick white discharge.  Contractions: Irregular.  Vag. Bleeding: None. Movement: Present. Denies leaking of fluid.   The following portions of the patient's history were reviewed and updated as appropriate: allergies, current medications, past family history, past medical history, past social history, past surgical history and problem list. Problem list updated.  Objective:   Filed Vitals:   08/27/15 1022  BP: 116/71  Pulse: 95  Weight: 180 lb (81.647 kg)    Fetal Status:     Movement: Present  Presentation: Vertex  General:  Alert, oriented and cooperative. Patient is in no acute distress.  Skin: Skin is warm and dry. No rash noted.   Cardiovascular: Normal heart rate noted  Respiratory: Normal respiratory effort, no problems with respiration noted  Abdomen: Soft, gravid, appropriate for gestational age. Pain/Pressure: Present     Pelvic: Vag. Bleeding: None Vag D/C Character: Thin   Cervical exam performed        Extremities: Normal range of motion.  Edema: None  Mental Status: Normal mood and affect. Normal behavior. Normal judgment and thought content.   Urinalysis:   Urine Glucose: Trace  Assessment and Plan:  Pregnancy: G3P1011 at 4227w0d  1. Vaginal yeast infection Diflucan x1  Term labor symptoms and general obstetric precautions including but not limited to vaginal bleeding, contractions, leaking of fluid and fetal movement were reviewed in detail with the patient. Please refer to After Visit Summary for other counseling recommendations.  No Follow-up on file.   Lesly DukesKelly H Shaughn Thomley, MD

## 2015-08-28 ENCOUNTER — Inpatient Hospital Stay (HOSPITAL_COMMUNITY)
Admission: AD | Admit: 2015-08-28 | Discharge: 2015-08-30 | DRG: 775 | Disposition: A | Discharge status: Home / Self Care | Payer: Medicaid Other | Source: Ambulatory Visit | Attending: Obstetrics and Gynecology | Admitting: Obstetrics and Gynecology

## 2015-08-28 ENCOUNTER — Encounter (HOSPITAL_COMMUNITY): Payer: Self-pay

## 2015-08-28 DIAGNOSIS — O480 Post-term pregnancy: Secondary | ICD-10-CM | POA: Diagnosis present

## 2015-08-28 DIAGNOSIS — O4292 Full-term premature rupture of membranes, unspecified as to length of time between rupture and onset of labor: Secondary | ICD-10-CM | POA: Diagnosis not present

## 2015-08-28 DIAGNOSIS — O99820 Streptococcus B carrier state complicating pregnancy: Secondary | ICD-10-CM | POA: Diagnosis not present

## 2015-08-28 DIAGNOSIS — Z3A40 40 weeks gestation of pregnancy: Secondary | ICD-10-CM | POA: Diagnosis not present

## 2015-08-28 DIAGNOSIS — O99824 Streptococcus B carrier state complicating childbirth: Secondary | ICD-10-CM | POA: Diagnosis present

## 2015-08-28 DIAGNOSIS — IMO0001 Reserved for inherently not codable concepts without codable children: Secondary | ICD-10-CM

## 2015-08-28 LAB — TYPE AND SCREEN
ABO/RH(D): O POS
ANTIBODY SCREEN: NEGATIVE

## 2015-08-28 LAB — CBC
HCT: 39.9 % (ref 36.0–46.0)
Hemoglobin: 13.7 g/dL (ref 12.0–15.0)
MCH: 30.5 pg (ref 26.0–34.0)
MCHC: 34.3 g/dL (ref 30.0–36.0)
MCV: 88.9 fL (ref 78.0–100.0)
PLATELETS: 104 10*3/uL — AB (ref 150–400)
RBC: 4.49 MIL/uL (ref 3.87–5.11)
RDW: 14.3 % (ref 11.5–15.5)
WBC: 11.9 10*3/uL — AB (ref 4.0–10.5)

## 2015-08-28 LAB — RPR: RPR: NONREACTIVE

## 2015-08-28 MED ORDER — ACETAMINOPHEN 325 MG PO TABS: 650.0000 mg | ORAL_TABLET | ORAL | Status: DC | PRN

## 2015-08-28 MED ORDER — LACTATED RINGERS IV SOLN
INTRAVENOUS | Status: DC
  Administered 2015-08-28: 500 mL/h via INTRAVENOUS

## 2015-08-28 MED ORDER — SENNOSIDES-DOCUSATE SODIUM 8.6-50 MG PO TABS
2.0000 | ORAL_TABLET | ORAL | Status: DC
  Administered 2015-08-29 – 2015-08-30 (×2): 2 via ORAL
  Filled 2015-08-28 (×2): qty 2

## 2015-08-28 MED ORDER — FENTANYL CITRATE (PF) 100 MCG/2ML IJ SOLN: 100.0000 ug | INTRAMUSCULAR | Status: DC | PRN

## 2015-08-28 MED ORDER — OXYTOCIN BOLUS FROM INFUSION: 500.0000 mL | INTRAVENOUS | Status: DC

## 2015-08-28 MED ORDER — TETANUS-DIPHTH-ACELL PERTUSSIS 5-2.5-18.5 LF-MCG/0.5 IM SUSP: 0.5000 mL | Freq: Once | INTRAMUSCULAR | Status: DC

## 2015-08-28 MED ORDER — OXYTOCIN 40 UNITS IN LACTATED RINGERS INFUSION - SIMPLE MED: 62.5000 mL/h | INTRAVENOUS | Status: DC

## 2015-08-28 MED ORDER — SIMETHICONE 80 MG PO CHEW: 80.0000 mg | CHEWABLE_TABLET | ORAL | Status: DC | PRN

## 2015-08-28 MED ORDER — OXYTOCIN 40 UNITS IN LACTATED RINGERS INFUSION - SIMPLE MED
INTRAVENOUS | Status: AC
  Filled 2015-08-28: qty 1000

## 2015-08-28 MED ORDER — SODIUM CHLORIDE 0.9 % IV SOLN
2.0000 g | Freq: Once | INTRAVENOUS | Status: AC
  Administered 2015-08-28: 2 g via INTRAVENOUS
  Filled 2015-08-28: qty 2000

## 2015-08-28 MED ORDER — DIPHENHYDRAMINE HCL 25 MG PO CAPS: 25.0000 mg | ORAL_CAPSULE | Freq: Four times a day (QID) | ORAL | Status: DC | PRN

## 2015-08-28 MED ORDER — OXYCODONE-ACETAMINOPHEN 5-325 MG PO TABS
2.0000 | ORAL_TABLET | ORAL | Status: DC | PRN
  Administered 2015-08-28 – 2015-08-29 (×2): 2 via ORAL
  Filled 2015-08-28: qty 2

## 2015-08-28 MED ORDER — OXYCODONE-ACETAMINOPHEN 5-325 MG PO TABS
1.0000 | ORAL_TABLET | ORAL | Status: DC | PRN
  Administered 2015-08-28 – 2015-08-30 (×3): 1 via ORAL
  Filled 2015-08-28 (×5): qty 1

## 2015-08-28 MED ORDER — LIDOCAINE HCL (PF) 1 % IJ SOLN: 30.0000 mL | INTRAMUSCULAR | Status: DC | PRN

## 2015-08-28 MED ORDER — OXYCODONE-ACETAMINOPHEN 5-325 MG PO TABS
2.0000 | ORAL_TABLET | ORAL | Status: DC | PRN
  Administered 2015-08-28: 2 via ORAL
  Filled 2015-08-28: qty 2

## 2015-08-28 MED ORDER — LACTATED RINGERS IV SOLN: INTRAVENOUS | Status: DC

## 2015-08-28 MED ORDER — LANOLIN HYDROUS EX OINT: TOPICAL_OINTMENT | CUTANEOUS | Status: DC | PRN

## 2015-08-28 MED ORDER — ONDANSETRON HCL 4 MG/2ML IJ SOLN: 4.0000 mg | Freq: Four times a day (QID) | INTRAMUSCULAR | Status: DC | PRN

## 2015-08-28 MED ORDER — ONDANSETRON HCL 4 MG/2ML IJ SOLN: 4.0000 mg | INTRAMUSCULAR | Status: DC | PRN

## 2015-08-28 MED ORDER — ONDANSETRON HCL 4 MG PO TABS: 4.0000 mg | ORAL_TABLET | ORAL | Status: DC | PRN

## 2015-08-28 MED ORDER — DIBUCAINE 1 % RE OINT: 1.0000 "application " | TOPICAL_OINTMENT | RECTAL | Status: DC | PRN

## 2015-08-28 MED ORDER — LACTATED RINGERS IV SOLN: 500.0000 mL | INTRAVENOUS | Status: DC | PRN

## 2015-08-28 MED ORDER — WITCH HAZEL-GLYCERIN EX PADS: 1.0000 "application " | MEDICATED_PAD | CUTANEOUS | Status: DC | PRN

## 2015-08-28 MED ORDER — BENZOCAINE-MENTHOL 20-0.5 % EX AERO
1.0000 "application " | INHALATION_SPRAY | CUTANEOUS | Status: DC | PRN
  Administered 2015-08-28: 1 via TOPICAL
  Filled 2015-08-28: qty 56

## 2015-08-28 MED ORDER — FENTANYL CITRATE (PF) 100 MCG/2ML IJ SOLN
100.0000 ug | INTRAMUSCULAR | Status: DC | PRN
  Administered 2015-08-28 (×2): 100 ug via INTRAVENOUS
  Filled 2015-08-28: qty 2

## 2015-08-28 MED ORDER — IBUPROFEN 600 MG PO TABS
600.0000 mg | ORAL_TABLET | Freq: Four times a day (QID) | ORAL | Status: DC
  Filled 2015-08-28: qty 1

## 2015-08-28 MED ORDER — OXYCODONE-ACETAMINOPHEN 5-325 MG PO TABS: 1.0000 | ORAL_TABLET | ORAL | Status: DC | PRN

## 2015-08-28 MED ORDER — OXYTOCIN 10 UNIT/ML IJ SOLN
INTRAMUSCULAR | Status: AC
  Administered 2015-08-28: 10 [IU]
  Filled 2015-08-28: qty 1

## 2015-08-28 MED ORDER — INFLUENZA VAC SPLIT QUAD 0.5 ML IM SUSY
0.5000 mL | PREFILLED_SYRINGE | INTRAMUSCULAR | Status: AC
  Administered 2015-08-29: 0.5 mL via INTRAMUSCULAR
  Filled 2015-08-28: qty 0.5

## 2015-08-28 MED ORDER — ZOLPIDEM TARTRATE 5 MG PO TABS
5.0000 mg | ORAL_TABLET | Freq: Every evening | ORAL | Status: DC | PRN
Start: 2015-08-28 — End: 2015-08-30

## 2015-08-28 MED ORDER — FENTANYL CITRATE (PF) 100 MCG/2ML IJ SOLN
100.0000 ug | INTRAMUSCULAR | Status: DC | PRN
  Filled 2015-08-28: qty 2

## 2015-08-28 MED ORDER — PRENATAL MULTIVITAMIN CH
1.0000 | ORAL_TABLET | Freq: Every day | ORAL | Status: DC
  Administered 2015-08-28 – 2015-08-29 (×2): 1 via ORAL
  Filled 2015-08-28 (×2): qty 1

## 2015-08-28 MED ORDER — CITRIC ACID-SODIUM CITRATE 334-500 MG/5ML PO SOLN: 30.0000 mL | ORAL | Status: DC | PRN

## 2015-08-28 NOTE — Lactation Note (Signed)
This note was copied from the chart of Monique Bethann HumbleOsasogie Vazques. Lactation Consultation Note Initial visit at 13 hours of age.  Mom reports having a hard time with positioning baby and thinks she doesn't have enough milk.  Mom has formula sitting on counter that she requested but has not given any to baby.  Discussed exclusively breast feeding benefits with baby and milk supply.  Baby asleep in crib, offered to assist with latch and [facility].  Mom is also eating.  [facility] resources given and discussed.  Encouraged to feed with early cues on demand.  Early newborn behavior discussed.  Hand expression demonstrated by mom with colostrum visible.  Mom to call for assist as needed.    Patient Name: Monique Hickman Reason for consult: Initial assessment   Maternal Data Has patient been taught Hand Expression?: Yes Does the patient have breastfeeding experience prior to this delivery?: Yes  Feeding Feeding Type: Formula  LATCH Score/Interventions                Intervention(s): Breastfeeding basics reviewed     Lactation Tools Discussed/Used     Consult Status Consult Status: Follow-up Date: 08/29/15 Follow-up type: In-patient    Beverely RisenShoptaw, Arvella MerlesJana Lynn Hickman, 10:58 PM

## 2015-08-28 NOTE — H&P (Signed)
LABOR ADMISSION HISTORY AND PHYSICAL  Monique Hickman is a 29 y.o. female G3P1011 with IUP at 7752w1d by 16w U/S presenting for SOL. She reports +FM, + contractions, No LOF, no VB, no blurry vision, headaches or peripheral edema, and RUQ pain. However, prior to admission at 0840 pt had SROM of clear fluid. She plans on breast feeding. She requests Mirena IUD for birth control. OB history significant for shoulder dystocia in prior pregnancy.   Dating: By 16w U/S --->  Estimated Date of Delivery: 08/27/15  Sono:    @[redacted]w[redacted]d , CWD, normal anatomy with limited views of fetal heart, breech presentation, vertex lie, 262 g, 49% EFW; anterior previa noted @[redacted]w[redacted]d , CWD, normal anatomy, breech presentation, vertex lie, 549 g, 51% EFW; anterior placenta with previa resolved @[redacted]w[redacted]d , CWD, normal antatomy, cephalic presentation, vertex lie, 2737 g, 70% EFW, anterior placenta, above cervical os  Prenatal History/Complications:  Past Medical History: Past Medical History  Diagnosis Date  . Ectopic pregnancy 2003    Lapt      Past Surgical History: Past Surgical History  Procedure Laterality Date  . Laparotomy      Removal of tube    Obstetrical History: OB History    Gravida Para Term Preterm AB TAB SAB Ectopic Multiple Living   3 1 1  1   1  1       Social History: Social History   Social History  . Marital Status: Married    Spouse Name: N/A  . Number of Children: N/A  . Years of Education: N/A   Occupational History  . CNA    Social History Main Topics  . Smoking status: Never Smoker   . Smokeless tobacco: Never Used  . Alcohol Use: No  . Drug Use: No  . Sexual Activity:    Partners: Male   Other Topics Concern  . None   Social History Narrative    Family History: History reviewed. No pertinent family history.  Allergies: No Known Allergies  Prescriptions prior to admission  Medication Sig Dispense Refill Last Dose  . acetaminophen (TYLENOL) 325 MG tablet Take 650 mg by  mouth every 6 (six) hours as needed for moderate pain.   Past Week at Unknown time  . Prenatal Vit-Fe Fumarate-FA (PRENATAL MULTIVITAMIN) TABS Take 1 tablet by mouth daily at 12 noon.   Past Week at Unknown time  . fluconazole (DIFLUCAN) 150 MG tablet Take 1 tablet (150 mg total) by mouth once. (Patient not taking: Reported on 08/28/2015) 1 tablet 0 Not Taking at Unknown time     Review of Systems   All systems reviewed and negative except as stated in HPI  BP 109/63 mmHg  Pulse 118  Temp(Src) 98 F (36.7 C) (Oral)  Resp 18  LMP 11/15/2014 General appearance: alert, cooperative and moderate distress Lungs: clear to auscultation bilaterally Heart: regular rate and rhythm Abdomen: gravid, nontender Pelvic: Cervix dilated to 8, 100% effaced, +1 station Extremities: Homans sign is negative, no sign of DVT, edema Presentation: cephalic Fetal monitoringBaseline: 140 bpm, Variability: Fair (1-6 bpm), Accelerations: Reactive and Decelerations: Absent Uterine activityFrequency: Every 4 minutes Dilation: 6 Effacement (%): 100 Station: -2 Exam by:: Dorrene GermanJ. Lowe RN  Prenatal labs: ABO, Rh: --/--/O POS (10/13 [id number]) Antibody: NEG (10/13 [id number]) Rubella:  9.74 (Immune) RPR: NON REAC (07/22 1113)  HBsAg: NEGATIVE (05/03 1548)  HIV: NONREACTIVE (07/22 1113)  GBS:  + in urine at initial OB visit, negative on 08/05/15 1 hr Glucola 108 Genetic screening  Quad neg Anatomy  US nml except for limited views of fetal heart d/t fetal position, anterior placenta previa. Repeat scans confirmed normal anatomy and resolution of placenta previa.   Prenatal Transfer Tool  Maternal Diabetes: No Genetic Screening: Normal Maternal Ultrasounds/Referrals: Abnormal:  Findings:   Other:limited view of heart outflow tracks x2 sono; previa resolved. F/U wnl @ 35 weeks with EFW 2737g, 70%ile.  Fetal Ultrasounds or other Referrals:  Other:  Maternal Substance Abuse:  No Significant Maternal Medications:   None Significant Maternal Lab Results: Lab values include: Group B Strep positive at initial prenatal visit  Results for orders placed or performed during the hospital encounter of 08/28/15 (from the past 24 hour(s))  CBC   Collection Time: 08/28/15  6:39 AM  Result Value Ref Range   WBC 11.9 (H) 4.0 - 10.5 K/uL   RBC 4.49 3.87 - 5.11 MIL/uL   Hemoglobin 13.7 12.0 - 15.0 g/dL   HCT 16.1 09.6 - 04.5 %   MCV 88.9 78.0 - 100.0 fL   MCH 30.5 26.0 - 34.0 pg   MCHC 34.3 30.0 - 36.0 g/dL   RDW 40.9 81.1 - 91.4 %   Platelets 104 (L) 150 - 400 K/uL  Type and screen [facility] [place] [facility]   Collection Time: 08/28/15  6:39 AM  Result Value Ref Range   ABO/RH(D) O POS    Antibody Screen NEG    Sample Expiration 08/31/2015     Patient Active Problem List   Diagnosis Date Noted  . Supervision of low-risk pregnancy 05/04/2015  . History of shoulder dystocia in prior pregnancy, currently pregnant in second trimester 03/18/2015  . History of ectopic pregnancy 03/18/2015  . Anal warts 03/18/2015    Assessment: Monique Hickman is a 29 y.o. G3P1011 at [redacted]w[redacted]d here for SOL in active labor. SROM @ 0840.  #Labor: Expectant management #Pain: Has received fentanyl. Requesting epidural. #FWB: Category II #ID:  GBS + at initial prenatal visit, started ampicillin 2 g at 0849 #MOF: Breast #MOC: Mirena IUD #Circ:  N/A (female)  Dani Gobble, MD [facility], PGY-1   OB FELLOW HISTORY AND PHYSICAL ATTESTATION  I have seen and examined this patient; I agree with above documentation in the resident's note.     Monique Hickman 08/28/2015, 2:02 PM

## 2015-08-28 NOTE — MAU Note (Signed)
Pt presents complaining of worsening contractions. Was 3cm in the office. Denies leaking or bleeding.

## 2015-08-29 NOTE — Lactation Note (Addendum)
This note was copied from the chart of Monique Bethann HumbleOsasogie Profitt. Lactation Consultation Note  Patient Name: Monique Hickman JXBJY'NToday's Date: 08/29/2015 Reason for consult: Follow-up assessment BF mom with L nipple pain. Mom does appear to have a faint compression stripe in the L nipples. The R nipple is "a little sore" and pears to have a dip in the middle of the nipple tip. Went over breast changes and nipple care. Had mom lean back, bring baby to her, and make sure the lips are flanged. She reported that this latch felt much better and she was able to achieve the latch unassisted. Mom is able to easily manually express milk bilaterally. She is aware of O/P lactation and support group. She will page as needed for bf help.   Maternal Data    Feeding Feeding Type: Breast Fed Length of feed: 5 min  LATCH Score/Interventions Latch: Too sleepy or reluctant, no latch achieved, no sucking elicited. Intervention(s): Skin to skin Intervention(s): Adjust position  Audible Swallowing: None Intervention(s): Hand expression  Type of Nipple: Everted at rest and after stimulation  Comfort (Breast/Nipple): Filling, red/small blisters or bruises, mild/mod discomfort  Problem noted: Mild/Moderate discomfort  Hold (Positioning): No assistance needed to correctly position infant at breast. Intervention(s): Position options;Support Pillows  LATCH Score: 5  Lactation Tools Discussed/Used     Consult Status Consult Status: Follow-up Date: 08/30/15 Follow-up type: In-patient    Monique Hickman 08/29/2015, 11:06 PM

## 2015-08-29 NOTE — Progress Notes (Signed)
Post Partum Day 1  Subjective:  Monique Hickman is a 29 y.o. [id number] 542w1d s/p SVD after SOL.  No acute events overnight.  Pt denies problems with ambulating, voiding or po intake.  She denies nausea or vomiting.  Pain is moderately controlled.  She has had flatus. She has not had bowel movement.  Lochia Moderate.  Plan for birth control is IUD.  Method of Feeding: Breast  Objective: BP 120/95 mmHg  Pulse 87  Temp(Src) 98 F (36.7 C) (Oral)  Resp 18  SpO2 99%  LMP 11/15/2014  Breastfeeding? Unknown  Physical Exam:  General: alert, cooperative and no distress Lochia:normal flow Chest: CTAB Heart: RRR no m/r/g Abdomen: +BS, soft, nontender, fundus firm at umbilicus DVT Evaluation: No evidence of DVT seen on physical exam. Extremities: no edema   Recent Labs  08/28/15 0639  HGB 13.7  HCT 39.9    Assessment/Plan:  ASSESSMENT: Monique HumbleOsasogie Lickteig is a 29 y.o. O9G2952G3P2012 122w1d ppd #1 s/p NSVD doing well.  Patient states she feels well and would like to go home but she believes baby will be staying due to increased bilirubin levels. Plan for discharge tomorrow, Breastfeeding, Lactation consult and Contraception IUD (outpatient)   LOS: 1 day   Mickie Hillieran McKeag 08/29/2015, 7:47 AM   OB fellow attestation Post Partum Day 1 I have seen and examined this patient and agree with above documentation in the resident's note.   Monique HumbleOsasogie Reisen is a 29 y.o. W4X3244G3P2012 s/p NSVD.  Pt denies problems with ambulating, voiding or po intake. Pain is well controlled.  Plan for birth control is IUD.  Method of Feeding: breast  PE:  BP 120/95 mmHg  Pulse 87  Temp(Src) 98 F (36.7 C) (Oral)  Resp 18  Ht 5\' 5"  (1.651 m)  Wt 180 lb (81.647 kg)  BMI 29.95 kg/m2  SpO2 99%  LMP 11/15/2014  Breastfeeding? Unknown Gen: well appearing Heart: reg rate Lungs: normal WOB Fundus firm Ext: soft, no pain, no edema  Plan for discharge: tomorrow. Continue routine pp care  Federico FlakeKimberly Niles Dario Yono, MD 4:04 PM

## 2015-08-29 NOTE — Progress Notes (Signed)
UR chart review completed.  

## 2015-08-30 MED ORDER — SENNOSIDES-DOCUSATE SODIUM 8.6-50 MG PO TABS: 2.0000 | ORAL_TABLET | ORAL | Status: DC

## 2015-08-30 NOTE — Discharge Instructions (Signed)

## 2015-08-30 NOTE — Discharge Summary (Signed)
OB Discharge Summary     Patient Name: Monique Hickman DOB: 06/14/1986 MRN: [id number]  Date of admission: 08/28/2015 Delivering MD: Shonna ChockWOUK, NOAH BEDFORD   Date of discharge: 08/30/2015  Admitting diagnosis: 40 WEEKS CTX Intrauterine pregnancy: 1412w1d     Secondary diagnosis: None     Discharge diagnosis: Term Pregnancy Delivered                                                                                                Post partum procedures:none  Augmentation: Pitocin  Complications: None  Hospital course:  Onset of Labor With Vaginal Delivery     29 y.o. yo W2N5621G3P2012 at 4012w1d was admitted in Active Laboron 08/28/2015. Patient had an uncomplicated labor course as follows:  Membrane Rupture Time/Date: 8:40 AM ,08/28/2015   Intrapartum Procedures: Episiotomy: None [1]                                         Lacerations:  None [1]  Patient had a delivery of a Viable infant. 08/28/2015  Information for the patient's newborn:  Riley KillOdeh, Girl Mela [[id number]][[id number]]  Delivery Method: Vag-Spont     Pateint had an uncomplicated postpartum course.  She is ambulating, tolerating a regular diet, passing flatus, and urinating well. Patient is discharged home in stable condition on No discharge date for patient encounter.Marland Kitchen.    Physical exam  Filed Vitals:   08/29/15 0552 08/29/15 0820 08/29/15 1830 08/30/15 0511  BP: 120/95  107/52 97/48  Pulse: 87  95 70  Temp: 98 F (36.7 C)  98.4 F (36.9 C) 97.5 F (36.4 C)  TempSrc: Oral  Oral Oral  Resp: 18  18 18   Height:  5\' 5"  (1.651 m)    Weight:  81.647 kg (180 lb)    SpO2:       General: alert, cooperative and no distress Lochia: appropriate Uterine Fundus: firm Incision: N/A DVT Evaluation: No evidence of DVT seen on physical exam. Negative Homan's sign. No significant calf/ankle edema. Labs: Lab Results  Component Value Date   WBC 11.9* 08/28/2015   HGB 13.7 08/28/2015   HCT 39.9 08/28/2015   MCV 88.9 08/28/2015   PLT 104*  08/28/2015   No flowsheet data found.  Discharge instruction: per After Visit Summary and "Baby and Me Booklet".  Medications:  Current facility-administered medications:  .  acetaminophen (TYLENOL) tablet 650 mg, 650 mg, Oral, Q4H PRN, Casey BurkittHillary Moen Fitzgerald, MD .  benzocaine-Menthol (DERMOPLAST) 20-0.5 % topical spray 1 application, 1 application, Topical, PRN, Casey BurkittHillary Moen Fitzgerald, MD, 1 application at 08/28/15 1213 .  witch hazel-glycerin (TUCKS) pad 1 application, 1 application, Topical, PRN **AND** dibucaine (NUPERCAINAL) 1 % rectal ointment 1 application, 1 application, Rectal, PRN, Casey BurkittHillary Moen Fitzgerald, MD .  diphenhydrAMINE (BENADRYL) capsule 25 mg, 25 mg, Oral, Q6H PRN, Casey BurkittHillary Moen Fitzgerald, MD .  lanolin ointment, , Topical, PRN, Casey BurkittHillary Moen Fitzgerald, MD .  ondansetron [facility](ZOFRAN) tablet 4 mg, 4 mg, Oral, Q4H PRN **OR** ondansetron (ZOFRAN) injection 4 mg,  4 mg, Intravenous, Q4H PRN, Casey Burkitt, MD .  oxyCODONE-acetaminophen (PERCOCET/ROXICET) 5-325 MG per tablet 1 tablet, 1 tablet, Oral, Q4H PRN, Casey Burkitt, MD, 1 tablet at 08/30/15 0154 .  oxyCODONE-acetaminophen (PERCOCET/ROXICET) 5-325 MG per tablet 2 tablet, 2 tablet, Oral, Q4H PRN, Casey Burkitt, MD, 2 tablet at 08/29/15 1857 .  prenatal multivitamin tablet 1 tablet, 1 tablet, Oral, Q1200, Casey Burkitt, MD, 1 tablet at 08/29/15 1209 .  senna-docusate (Senokot-S) tablet 2 tablet, 2 tablet, Oral, Q24H, Casey Burkitt, MD, 2 tablet at 08/30/15 0055 .  simethicone (MYLICON) chewable tablet 80 mg, 80 mg, Oral, PRN, Casey Burkitt, MD .  zolpidem (AMBIEN) tablet 5 mg, 5 mg, Oral, QHS PRN, Casey Burkitt, MD  Diet: routine diet  Activity: Advance as tolerated. Pelvic rest for 6 weeks.   Outpatient follow up:6 weeks  Postpartum contraception: IUD Mirena  Newborn Data: Live born female  Birth Weight: 8 lb 11.2 oz (3946 g) APGAR: 8, 9  Baby  Feeding: Breast Disposition:home with mother   08/30/2015 Mickie Hillier, MD   I spoke with and examined patient and agree with resident/PA/SNM's note and plan of care.  Cheral Marker, CNM, [facility] 08/30/2015 8:54 AM

## 2015-09-01 ENCOUNTER — Other Ambulatory Visit: Payer: Medicaid Other

## 2015-09-02 ENCOUNTER — Encounter: Payer: Self-pay | Admitting: *Deleted

## 2015-09-03 ENCOUNTER — Inpatient Hospital Stay (HOSPITAL_COMMUNITY): Payer: Medicaid Other

## 2015-10-17 ENCOUNTER — Ambulatory Visit (INDEPENDENT_AMBULATORY_CARE_PROVIDER_SITE_OTHER): Payer: Medicaid Other | Admitting: Family

## 2015-10-17 ENCOUNTER — Encounter (INDEPENDENT_AMBULATORY_CARE_PROVIDER_SITE_OTHER): Payer: Self-pay

## 2015-10-17 ENCOUNTER — Encounter: Payer: Self-pay | Admitting: Family

## 2015-10-17 DIAGNOSIS — Z30430 Encounter for insertion of intrauterine contraceptive device: Secondary | ICD-10-CM | POA: Diagnosis not present

## 2015-10-17 DIAGNOSIS — Z01812 Encounter for preprocedural laboratory examination: Secondary | ICD-10-CM

## 2015-10-17 DIAGNOSIS — Z3202 Encounter for pregnancy test, result negative: Secondary | ICD-10-CM

## 2015-10-17 LAB — POCT URINE PREGNANCY: Preg Test, Ur: NEGATIVE

## 2015-10-17 NOTE — Progress Notes (Signed)
Patient ID: Monique Hickman, female   DOB: 02/16/1986, 29 y.o.   MRN: [id number] Post Partum Exam  Monique Hickman is a 29 y.o. Y3K1601G3P2012 female who presents for a postpartum visit. She is 7 weeks postpartum following a spontaneous vaginal delivery. I have fully reviewed the prenatal and intrapartum course. The delivery was at 3344w1d gestational weeks.  Anesthesia: none. Postpartum course has been unremarkable. Baby's course has been unremarkable. Baby is feeding by both breast and bottle - Similac Advance. Bleeding no bleeding. Bowel function is normal. Bladder function is normal. Patient is sexually active. Desires contraception method of IUD. Postpartum depression screening: negative.  The following portions of the patient's history were reviewed and updated as appropriate: allergies, current medications, past family history, past medical history, past social history, past surgical history and problem list.  Review of Systems Pertinent items are noted in HPI.   Objective:    BP 116/78 mmHg  Pulse 78  Resp 16  Ht 5\' 5"  (1.651 m)  Wt 211 lb (95.709 kg)  BMI 35.11 kg/m2  Breastfeeding? Yes   General:  alert, cooperative and appears stated age   Breasts:  inspection negative, no nipple discharge or bleeding, no masses or nodularity palpable  Lungs: clear to auscultation bilaterally  Heart:  regular rate and rhythm, S1, S2 normal, no murmur, click, rub or gallop  Abdomen: soft, non-tender; bowel sounds normal; no masses,  no organomegaly   Vulva:  normal  Vagina: normal vagina, no discharge, exudate, lesion, or erythema; healed well  Cervix:  no cervical motion tenderness  Corpus: normal size, contour, position, consistency, mobility, non-tender  Adnexa:  normal adnexa  Rectal Exam: Not performed.   Pt consented for IUD insertion.  Pt placed in lithotomy position.  Speculum inserted and cervix cleaned with betadine solution.  Uterus sounded to 7cm; IUD inserted without difficulty.  Strings cut at  approximately 3 cm.  Speculum removed.       Assessment:    Normal postpartum exam. Pap smear not done at today's visit.  IUD Insertion  Plan:    1. Contraception: IUD 2. Follow up as needed.   Eino FarberWalidah Kennith GainN Karim, CNM

## 2015-10-20 DIAGNOSIS — Z30430 Encounter for insertion of intrauterine contraceptive device: Secondary | ICD-10-CM | POA: Diagnosis not present

## 2015-10-20 MED ORDER — LEVONORGESTREL 20 MCG/24HR IU IUD
1.0000 | INTRAUTERINE_SYSTEM | Freq: Once | INTRAUTERINE | Status: AC
  Administered 2015-10-20: 1 via INTRAUTERINE

## 2015-10-20 NOTE — Addendum Note (Signed)
Addended by: Arne ClevelandHUTCHINSON, MANDY J on: 10/20/2015 08:09 AM   Modules accepted: Orders

## 2015-12-02 ENCOUNTER — Encounter: Payer: Self-pay | Admitting: Obstetrics & Gynecology

## 2016-09-05 IMAGING — US US OB FOLLOW-UP
1 series · 12 of 28 positions shown · non-contrast
Comparison: none

[Series 1: us ob follow up · 58 acquisitions, 12 frames shown]
[im 3/58]
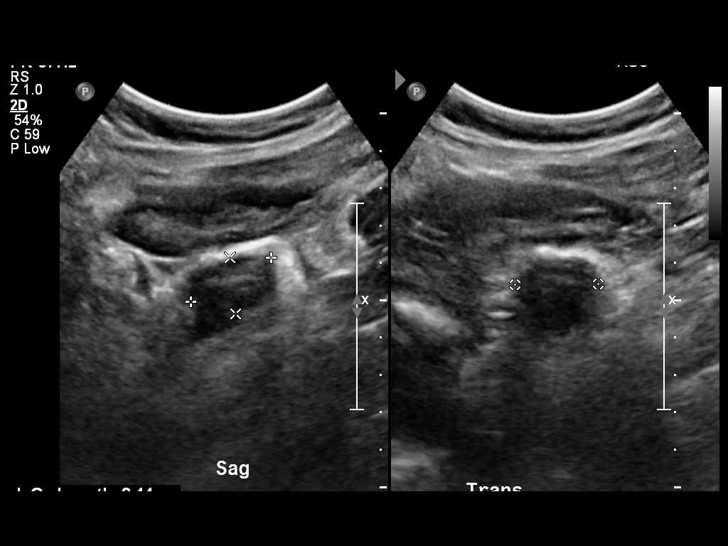
[im 7/58]
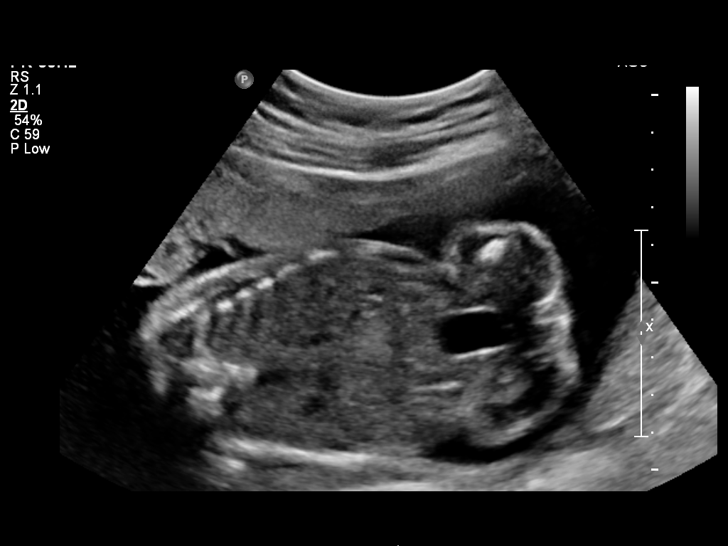
[im 11/58]
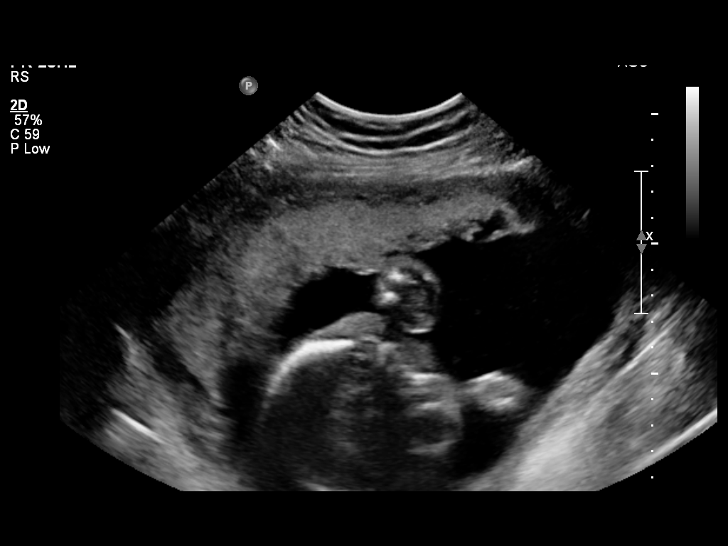
[im 17/58]
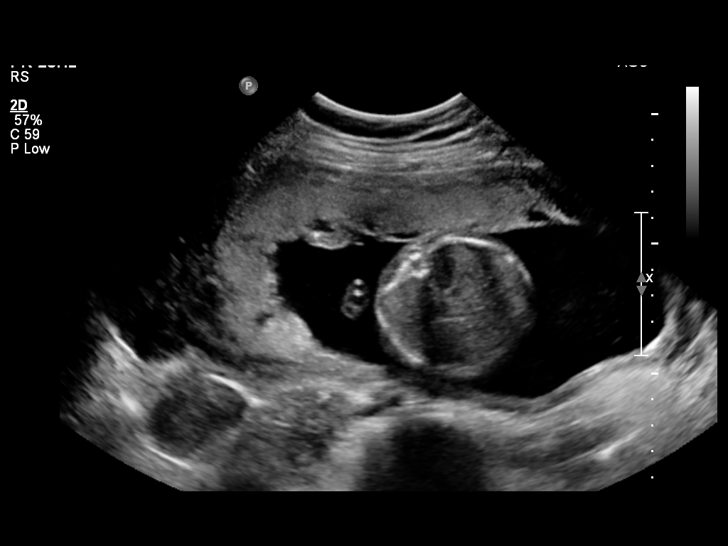
[im 22/58]
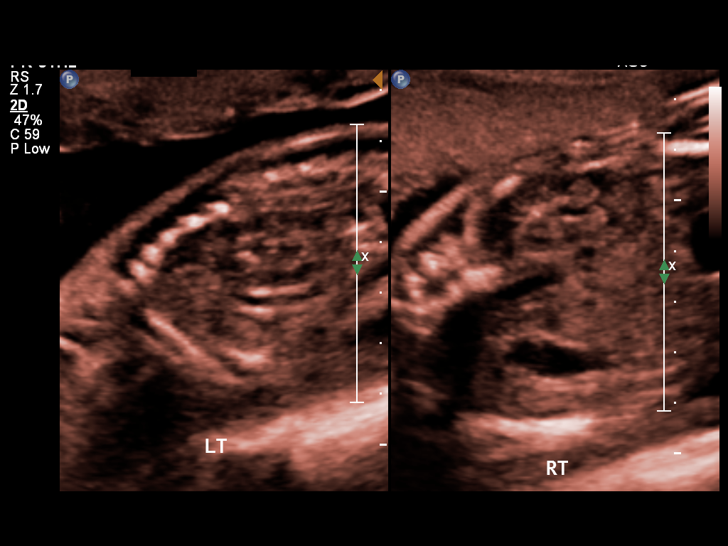
[im 26/58]
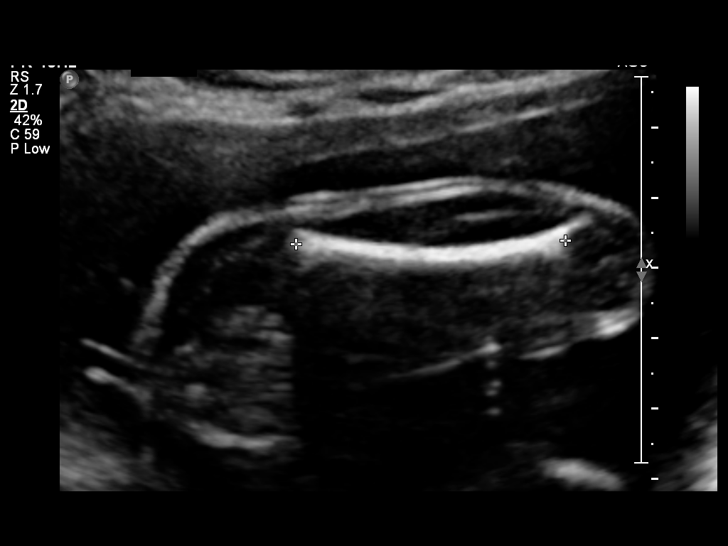
[im 32/58]
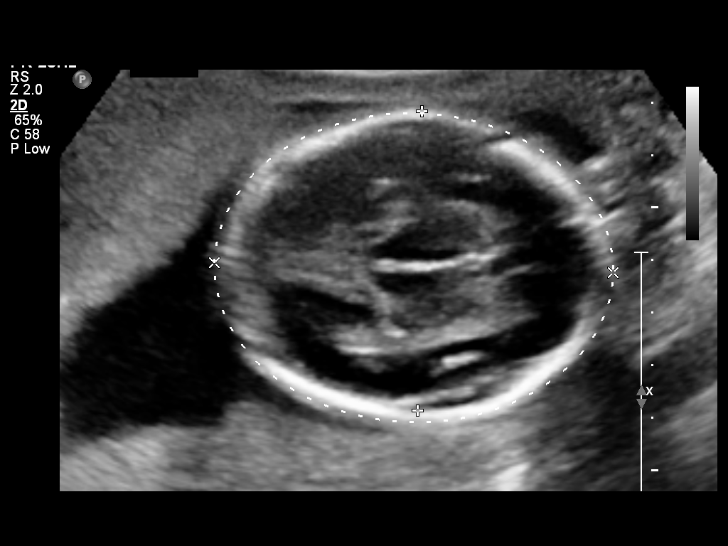
[im 36/58]
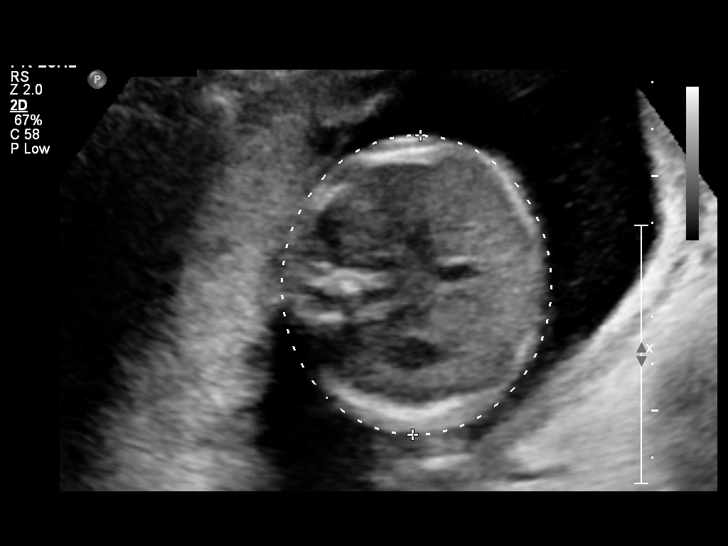
[im 41/58]
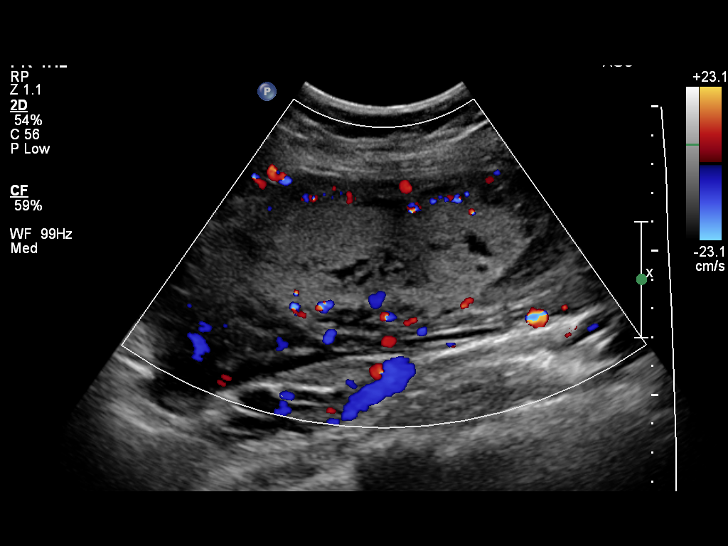
[im 47/58]
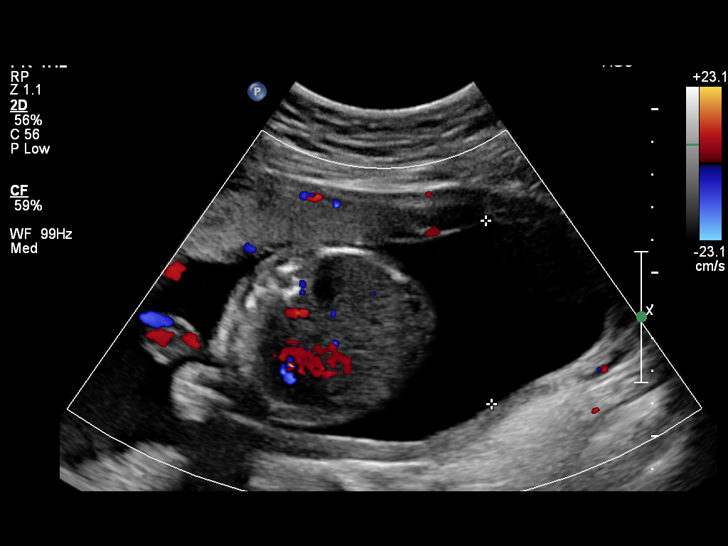
[im 51/58]
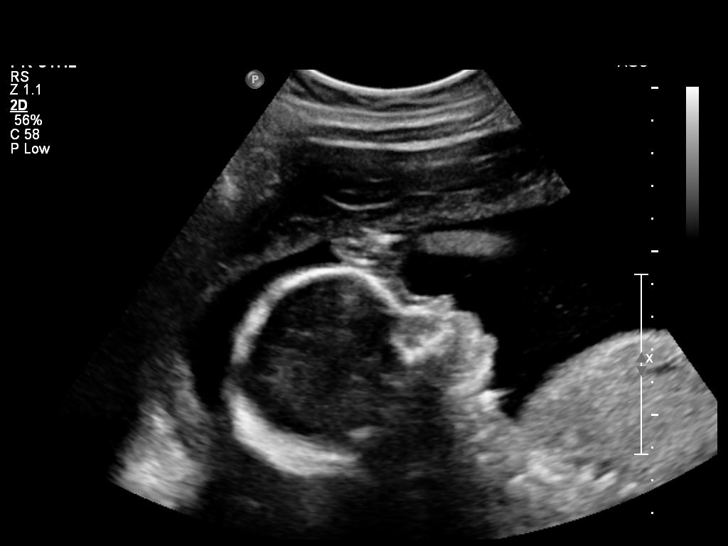
[im 55/58]
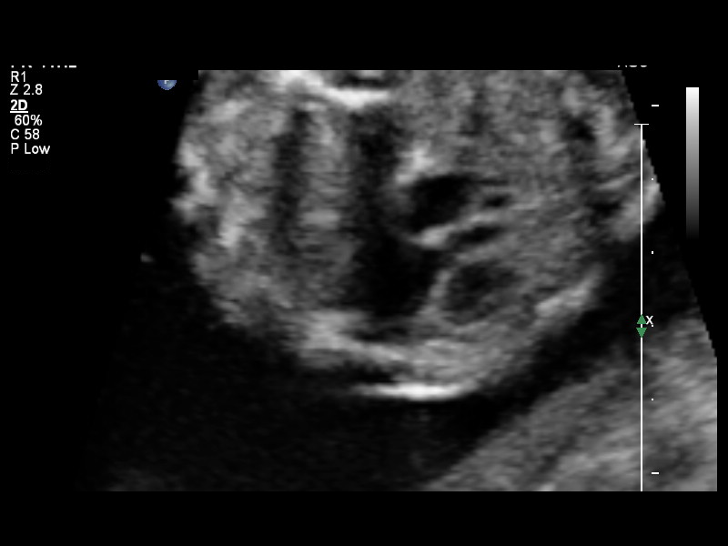

[12 of 28 positions shown; findings below may reference images not displayed]

OBSTETRICS REPORT
(Signed Final 04/28/2015 [DATE])

Date:

Service(s) Provided

US OB FOLLOW UP                                        76816.1
Indications

Follow-up incomplete fetal anatomic evaluation         Z36
23 weeks gestation of pregnancy
Placenta previa/Low lying: Bleeding
Fetal Evaluation

Num Of             1
Fetuses:
Fetal Heart        149                          bpm
Rate:
Cardiac Activity:  Observed
Presentation:      Breech
Placenta:          Anterior, above cervical
os
P. Cord            Previously Visualized
Insertion:

Amniotic Fluid
AFI FV:      Subjectively within normal limits
Larg Pckt:    5.59   cm
Biometry

BPD:     57.1   m    G. Age:   23w 3d                 CI:        72.91   70 - 86
m
FL/HC:      18.0   19.2 -
20.8
HC:     212.6   m    G. Age:   23w 3d        43  %    HC/AC:      1.14   1.05 -
m
AC:     185.9   m    G. Age:   23w 3d        49  %    FL/BPD      66.9   71 - 87
m                                     :
FL:      38.2   m    G. Age:   22w 2d        14  %    FL/AC:      20.5   20 - 24
m
HUM:     36.8   m    G. Age:   22w 6d        36  %
m
Est.         549   gm    1 lb 3 oz      51   %
FW:
Gestational Age

LMP:           23w 3d        Date:  11/15/14                  EDD:   08/22/15
U/S Today:     23w 1d                                         EDD:   08/24/15
Best:          23w 1d    Det. By:   U/S (03/28/15)            EDD:   08/24/15
Anatomy

Cranium:          Previously seen        Aortic Arch:       Previously seen
Fetal Cavum:      Previously seen        Ductal Arch:       Previously seen
Ventricles:       Appears normal         Diaphragm:         Previously seen
Choroid Plexus:   Previously seen        Stomach:           Appears normal,
left sided
Cerebellum:       Previously seen        Abdomen:           Previously seen
Posterior         Previously seen        Abdominal          Previously seen
Fossa:                                   Wall:
Nuchal Fold:      Not applicable (>20    Cord Vessels:      Previously seen
wks GA)
Face:             Orbits previously      Kidneys:           Appear normal
seen
Lips:             Previously seen        Bladder:           Appears normal
Heart:            Appears normal         Spine:             Previously seen
(4CH, axis, and
situs)
RVOT:             Not well visualized    Lower              Previously seen
Extremities:
LVOT:             Appears normal         Upper              Previously seen
Extremities:

Other:   Fetus appears to be a female. Heels previously visualized.
Targeted Anatomy

Fetal Central Nervous System
Lat. Ventricles:
Cervix Uterus Adnexa

Cervical Length:    3.47      cm

Cervix:       Normal appearance by transabdominal scan.
Uterus:       No abnormality visualized.

Left Ovary:    Size(cm) L: 2.44 x W: 2.21 x H: 1.5  Volume(cc):
4.2
Right Ovary:   Within normal limits.

Adnexa:     No abnormality visualized. No adnexal mass visualized.
Impression

Single IUP at 23w 1d
Normal interval anatomy
Somewhat limited views of the fetal heart were obtained
(RVOT)
Fetal growth is appropriate (51st %tile)
Anterior placenta- the previously noted placenta previa has
resolved
Normal amniotic fluid volume
Recommendations

Follow-up ultrasounds as clinically indicated.
If ultrasound for another clinical indication is performed,
would try to clear the outflow tracts at that time.

## 2016-11-29 IMAGING — US US MFM OB FOLLOW-UP
1 series · 14 of 28 positions shown · non-contrast
Comparison: none

[Series 1: us mfm ob follow-up · 14 of 39 slices shown]
[im 2/39]
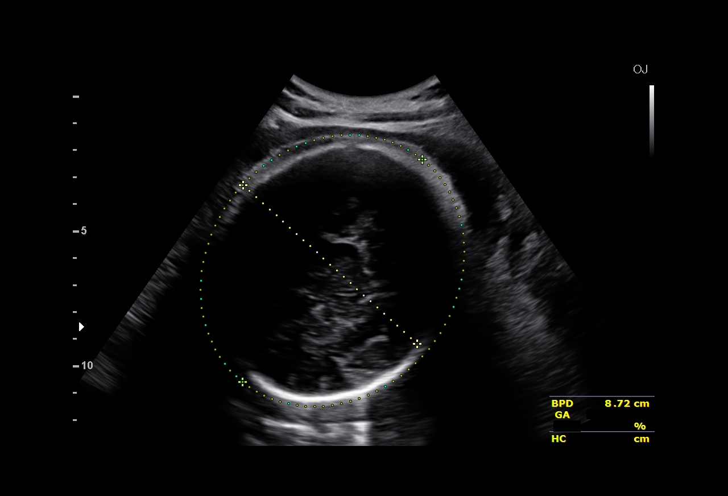
[im 5/39]
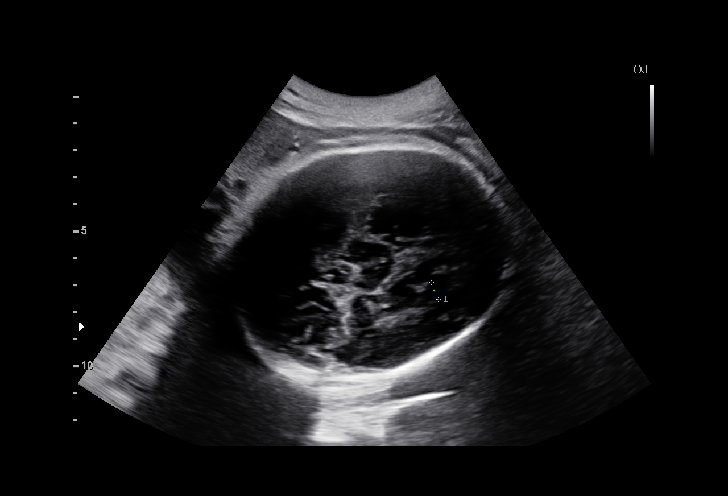
[im 8/39]
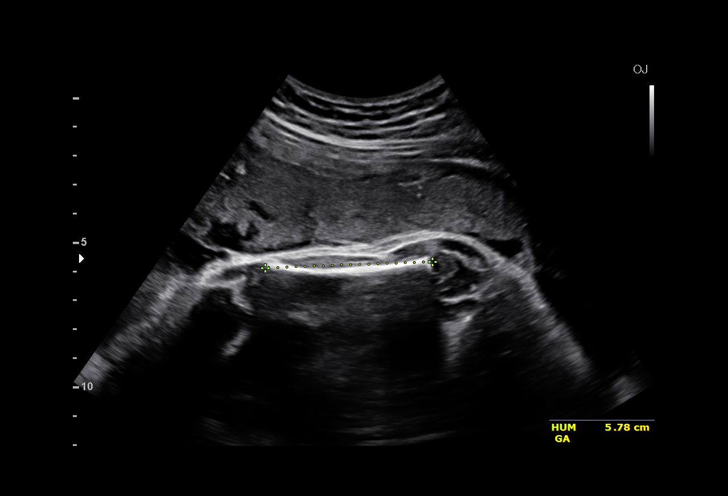
[im 10/39]
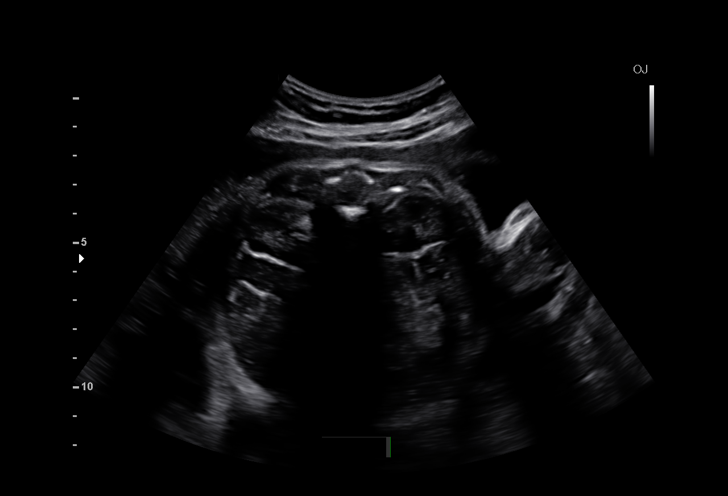
[im 13/39]
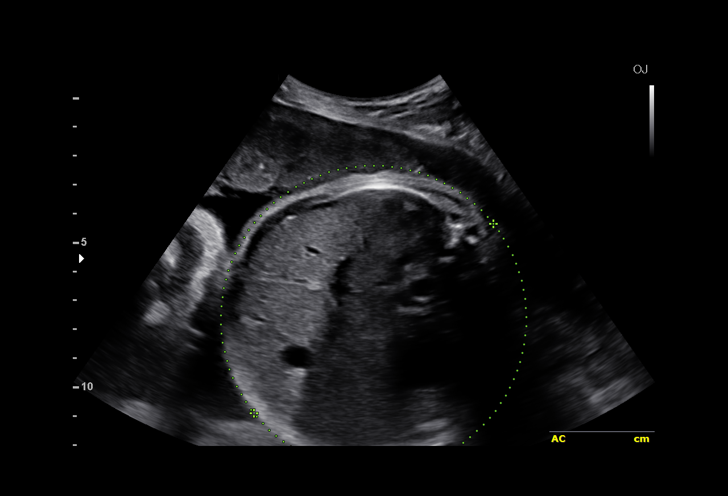
[im 16/39]
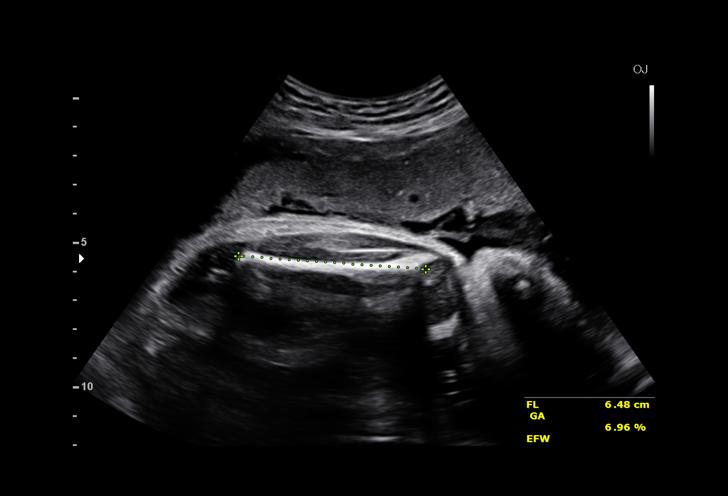
[im 19/39]
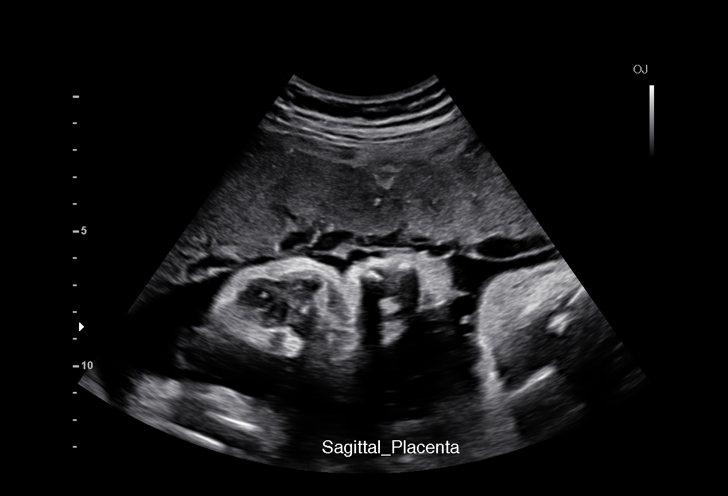
[im 22/39]
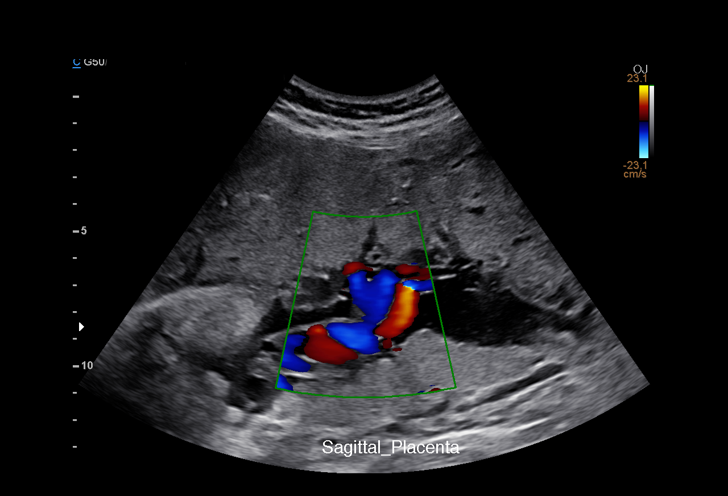
[im 24/39]
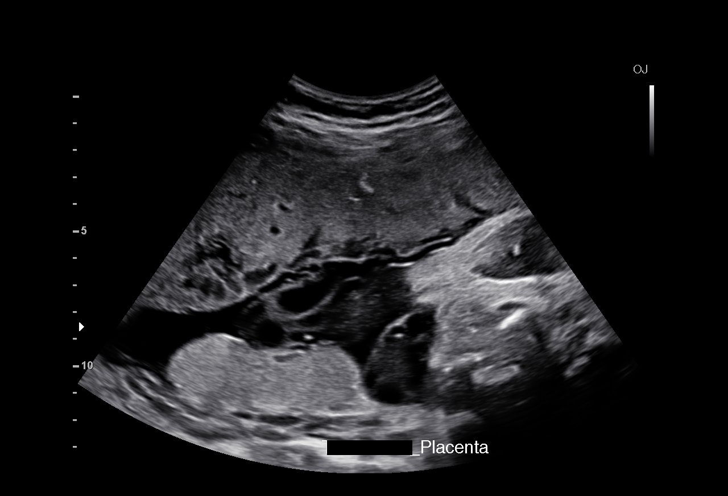
[im 27/39]
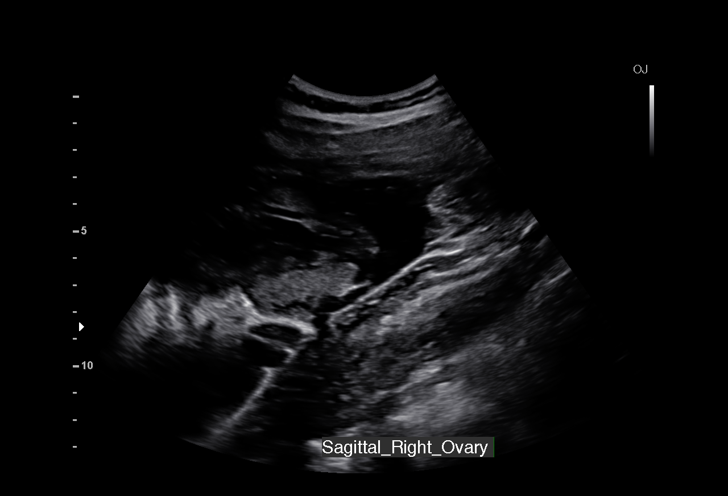
[im 30/39]
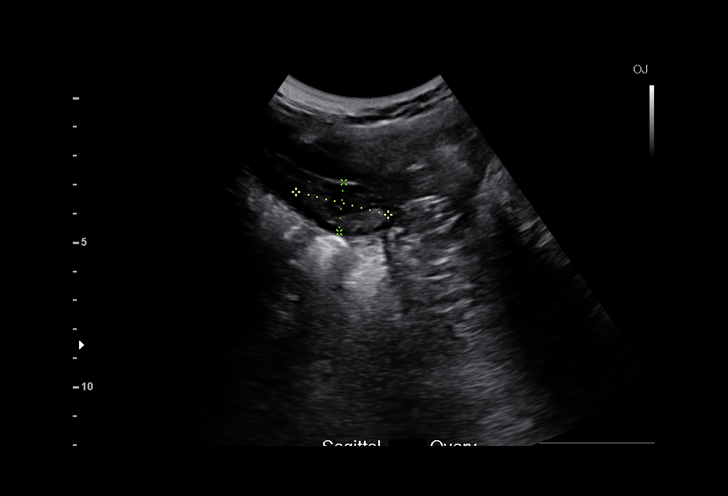
[im 33/39]
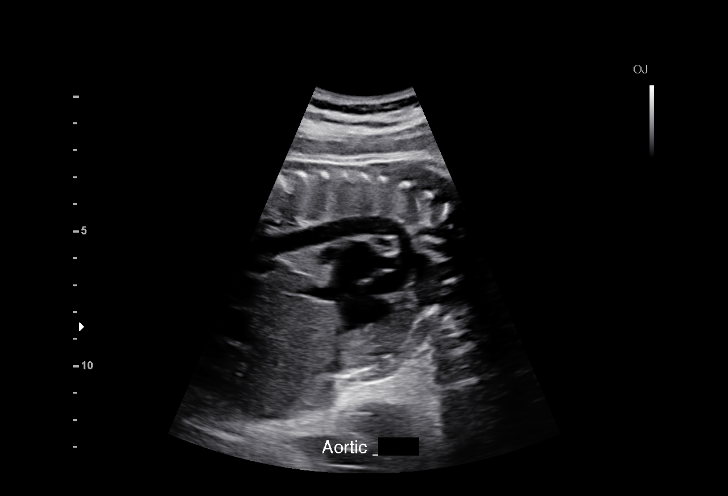
[im 36/39]
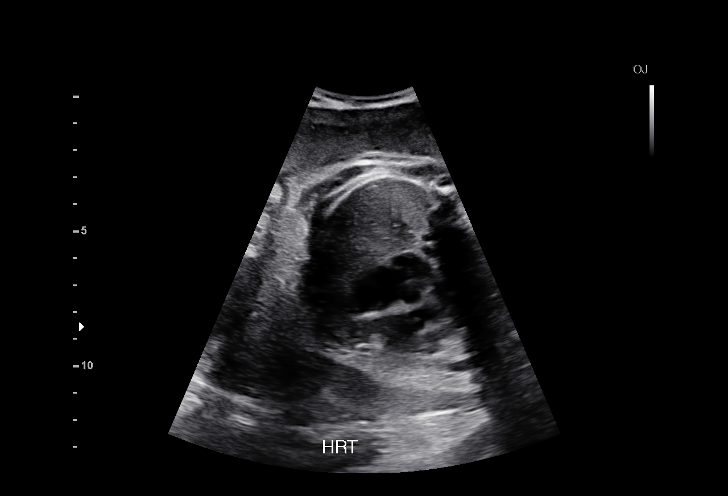
[im 39/39]
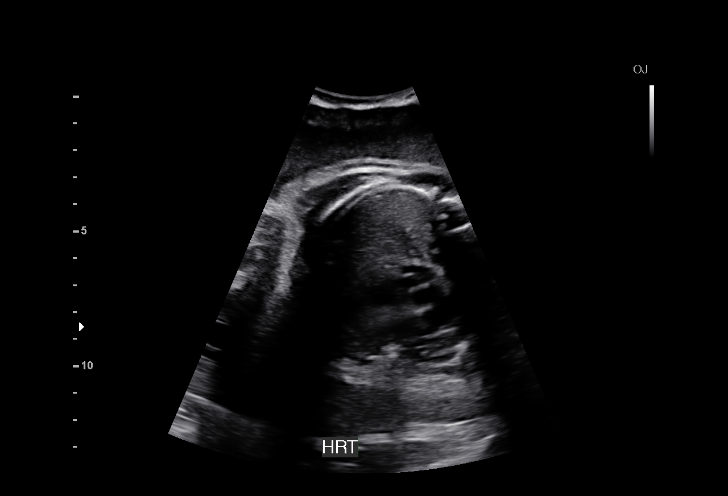

[14 of 28 positions shown; findings below may reference images not displayed]

OBSTETRICS REPORT
(Signed Final 07/22/2015 [DATE])

Service(s) Provided

Indications

35 weeks gestation of pregnancy
Follow-up incomplete fetal anatomic evaluation        Z36
Poor obstetric history: Previous macrosomia
Fetal Evaluation

Num Of Fetuses:    1
Fetal Heart Rate:  150                          bpm
Cardiac Activity:  Observed
Presentation:      Cephalic
Placenta:          Anterior, above cervical os
P. Cord            Visualized
Insertion:

Amniotic Fluid
AFI FV:      Subjectively within normal limits
AFI Sum:     15.42   cm       56  %Tile     Larg Pckt:    5.28  cm
RUQ:   5.28    cm   RLQ:    4.77   cm    LUQ:   2.56    cm   LLQ:    2.81   cm
Biometry

BPD:     87.3  mm     G. Age:  35w 2d                CI:        78.48   70 - 86
FL/HC:      20.8   20.1 -
22.3
HC:     311.7  mm     G. Age:  34w 6d       12  %    HC/AC:      0.94   0.93 -
1.11
AC:     330.5  mm     G. Age:  37w 0d       92  %    FL/BPD:     74.2   71 - 87
FL:      64.8  mm     G. Age:  33w 3d        8  %    FL/AC:      19.6   20 - 24
HUM:     58.3  mm     G. Age:  33w 5d       37  %

Est. FW:    9626  gm      6 lb 1 oz     70  %
Gestational Age

LMP:           35w 4d        Date:  11/15/14                 EDD:   08/22/15
U/S Today:     35w 1d                                        EDD:   08/25/15
Best:          35w 2d     Det. By:  U/S (03/28/15)           EDD:   08/24/15
Anatomy
Cranium:          Appears normal         Aortic Arch:      Appears normal
Fetal Cavum:      Appears normal         Ductal Arch:      Previously seen
Ventricles:       Appears normal         Diaphragm:        Appears normal
Choroid Plexus:   Previously seen        Stomach:          Appears normal, left
sided
Cerebellum:       Previously seen        Abdomen:          Appears normal
Posterior Fossa:  Previously seen        Abdominal Wall:   Previously seen
Nuchal Fold:      Not applicable (>20    Cord Vessels:     Previously seen
wks GA)
Face:             Orbits previously      Kidneys:          Appear normal
seen
Lips:             Previously seen        Bladder:          Appears normal
Heart:            Appears normal         Spine:            Previously seen
(4CH, axis, and
situs)
RVOT:             Appears normal         Lower             Previously seen
Extremities:
LVOT:             Previously seen        Upper             Previously seen
Extremities:

Other:  Fetus appears to be a female. Heels previously visualized.
Cervix Uterus Adnexa

Cervix:       Not visualized (advanced GA >06wks)
Left Ovary:    Size(cm) L: 3.28 x W: 3.55 x H: 1.74  Volume(cc):
10.6
Right Ovary:   Size(cm) L: 3.58 x W: 2.71 x H: 1.78  Volume(cc): 9
Impression

Single IUP at 35w 2d
Hx of previous LGA newborn
Normal interval anatomy
Fetal growth is appropriate (70th %tile).  The AC measures at
the 92nd %tile.
Anterior placenta without previa
Normal amniotic fluid volume
Recommendations

Follow-up ultrasounds as clinically indicated.

questions or concerns.

## 2019-10-16 ENCOUNTER — Ambulatory Visit: Payer: Self-pay | Admitting: Advanced Practice Midwife

## 2019-10-30 ENCOUNTER — Ambulatory Visit (INDEPENDENT_AMBULATORY_CARE_PROVIDER_SITE_OTHER): Payer: Managed Care, Other (non HMO) | Admitting: Advanced Practice Midwife

## 2019-10-30 ENCOUNTER — Encounter: Payer: Self-pay | Admitting: Advanced Practice Midwife

## 2019-10-30 ENCOUNTER — Other Ambulatory Visit: Payer: Self-pay

## 2019-10-30 VITALS — BP 97/62 | HR 83 | Resp 16 | Ht 65.0 in | Wt 188.0 lb

## 2019-10-30 DIAGNOSIS — Z1151 Encounter for screening for human papillomavirus (HPV): Secondary | ICD-10-CM | POA: Diagnosis not present

## 2019-10-30 DIAGNOSIS — N760 Acute vaginitis: Secondary | ICD-10-CM

## 2019-10-30 DIAGNOSIS — Z113 Encounter for screening for infections with a predominantly sexual mode of transmission: Secondary | ICD-10-CM

## 2019-10-30 DIAGNOSIS — Z01419 Encounter for gynecological examination (general) (routine) without abnormal findings: Secondary | ICD-10-CM

## 2019-10-30 DIAGNOSIS — N898 Other specified noninflammatory disorders of vagina: Secondary | ICD-10-CM | POA: Diagnosis not present

## 2019-10-30 DIAGNOSIS — Z124 Encounter for screening for malignant neoplasm of cervix: Secondary | ICD-10-CM

## 2019-10-30 DIAGNOSIS — B373 Candidiasis of vulva and vagina: Secondary | ICD-10-CM | POA: Diagnosis not present

## 2019-10-30 DIAGNOSIS — B9689 Other specified bacterial agents as the cause of diseases classified elsewhere: Secondary | ICD-10-CM | POA: Diagnosis not present

## 2019-10-30 DIAGNOSIS — B3731 Acute candidiasis of vulva and vagina: Secondary | ICD-10-CM

## 2019-10-30 MED ORDER — FLUCONAZOLE 150 MG PO TABS: 150.0000 mg | ORAL_TABLET | Freq: Once | ORAL | 1 refills | Status: AC

## 2019-10-30 NOTE — Progress Notes (Signed)
GYNECOLOGY ANNUAL PREVENTATIVE CARE ENCOUNTER NOTE  History:     Monique Hickman is a 33 y.o. G60P2012 female here for a routine annual gynecologic exam.  Current complaints: patient reports thick, white, clumpy vaginal discharge since end of November. Reports that she usually gets yeast infections around this time of year. She denies itching/irritation, or odor. Denies abnormal vaginal bleeding, pelvic pain, problems with intercourse or other gynecologic concerns. She would like STD testing today. Patient also would like to discuss weight management. 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 Tends to eat a lot of white rice, chic-fil-a, and drinks sweet tea. Reports she eats more at night, especially while she is working.    Gynecologic History Patient's last menstrual period was 09/30/2019. Contraception: IUD Last Pap: 03/18/2015. Results were: normal  Last mammogram: N/A  Obstetric History OB History  Gravida Para Term Preterm AB Living  3 2 2   1 2   SAB TAB Ectopic Multiple Live Births      1 0 2    # Outcome Date GA Lbr Len/2nd Weight Sex Delivery Anes PTL Lv  3 Term 08/28/15 [redacted]w[redacted]d 03:46 / 00:12 3946 g F Vag-Spont None  LIV  2 Term 07/20/13 [redacted]w[redacted]d  4122 g M Vag-Spont EPI  LIV  1 Ectopic 03/30/02            Past Medical History:  Diagnosis Date  . Ectopic pregnancy 2003   Lapt      Past Surgical History:  Procedure Laterality Date  . LAPAROTOMY     Removal of tube    Current Outpatient Medications on File Prior to Visit  Medication Sig Dispense Refill  . levonorgestrel (MIRENA) 20 MCG/24HR IUD 1 each by Intrauterine route once.    2004 acetaminophen (TYLENOL) 325 MG tablet Take 650 mg by mouth every 6 (six) hours as needed for moderate pain.     No current facility-administered medications on file prior to visit.    No Known Allergies  Social History:  reports that she has never smoked. She  has never used smokeless tobacco. She reports that she does not drink alcohol or use drugs.  History reviewed. No pertinent family history.  The following portions of the patient's history were reviewed and updated as appropriate: allergies, current medications, past family history, past medical history, past social history, past surgical history and problem list.  Review of Systems Pertinent items noted in HPI and remainder of comprehensive ROS otherwise negative.  Physical Exam:  BP 97/62   Pulse 83   Resp 16   Ht 5\' 5"  (1.651 m)   Wt 85.3 kg   LMP 09/30/2019   Breastfeeding No   BMI 31.28 kg/m  CONSTITUTIONAL: Well-developed, well-nourished female in no acute distress.  HENT: Normocephalic, atraumatic, External right and left ear normal. Oropharynx is clear and moist EYES: Conjunctivae and EOM are normal. Pupils are equal, round, and reactive to light. No scleral icterus.  NECK: Normal range of motion, supple, no masses. Normal thyroid without mass.  SKIN: Skin is warm and dry. No rash noted. Not diaphoretic. No erythema. No pallor. MUSCULOSKELETAL: Normal range of motion. No tenderness.  No cyanosis, clubbing, or edema.  2+ distal pulses. NEUROLOGIC: Alert and oriented to person, place, and time. Normal reflexes, muscle tone coordination. No cranial nerve deficit noted. PSYCHIATRIC: Normal mood and affect. Normal behavior. Normal judgment and thought content. CARDIOVASCULAR: Normal heart rate noted,  regular rhythm RESPIRATORY: Clear to auscultation bilaterally. Effort and breath sounds normal, no problems with respiration noted. BREASTS: Symmetric in size. No masses, skin changes, nipple drainage, or lymphadenopathy. ABDOMEN: Soft, normal bowel sounds, no distention noted.  No tenderness, rebound or guarding.  PELVIC: Normal appearing external genitalia without lesions; normal appearing pink, rugated, vaginal mucosa with thick, white patches noted along vaginal wall c/w yeast;  cervix smooth, pink, without masses. IUD strings visualized. Pap smear obtained.  Normal uterine size, no other palpable masses, no uterine or adnexal tenderness.   Assessment and Plan:  1. Well woman exam with routine gynecological exam - Routine well woman exam with pap - Patient requesting STD panel - Discussed weight management, including cutting back carbs such as white rice and sweet tea, include more protein and vegetables in diet. Start an exercise regimen; recommend walking regimen 2-3 times per week for 20 minutes per day and gradually increase as tolerated  - HIV antibody - RPR - Hepatitis C antibody - Hepatitis B surface antigen - Cytology - PAP( [facility]) - Cervicovaginal ancillary only( )  2. Vaginal candidiasis - Patient reports thick, white, clumpy discharge; speculum exam shows obvious yeast along vaginal walls - Rx for Diflucan sent to pharmacy  - fluconazole (DIFLUCAN) 150 MG tablet; Take 1 tablet (150 mg total) by mouth once for 1 dose.  Dispense: 1 tablet; Refill: 1    Will follow up results of pap smear and manage accordingly. Routine preventative health maintenance measures emphasized. Please refer to After Visit Summary for other counseling recommendations.   Follow up in 1 year for well woman exam or as needed.  Monique Hickman, SNM

## 2019-10-31 LAB — CERVICOVAGINAL ANCILLARY ONLY
Bacterial Vaginitis (gardnerella): POSITIVE — AB
Candida Glabrata: NEGATIVE
Candida Vaginitis: POSITIVE — AB
Chlamydia: NEGATIVE
Comment: NEGATIVE
Comment: NEGATIVE
Comment: NEGATIVE
Comment: NEGATIVE
Comment: NEGATIVE
Comment: NORMAL
Neisseria Gonorrhea: NEGATIVE
Trichomonas: NEGATIVE

## 2019-10-31 LAB — RPR: RPR Ser Ql: NONREACTIVE

## 2019-10-31 LAB — HEPATITIS B SURFACE ANTIGEN: Hepatitis B Surface Ag: NONREACTIVE

## 2019-10-31 LAB — HEPATITIS C ANTIBODY
Hepatitis C Ab: NONREACTIVE
SIGNAL TO CUT-OFF: 0.14 (ref <1.00)

## 2019-10-31 LAB — HIV ANTIBODY (ROUTINE TESTING W REFLEX): HIV 1&2 Ab, 4th Generation: NONREACTIVE

## 2019-11-01 LAB — CYTOLOGY - PAP
Comment: NEGATIVE
Diagnosis: NEGATIVE
High risk HPV: NEGATIVE

## 2020-04-01 ENCOUNTER — Ambulatory Visit: Payer: Managed Care, Other (non HMO) | Attending: Internal Medicine

## 2020-04-01 DIAGNOSIS — Z23 Encounter for immunization: Secondary | ICD-10-CM

## 2020-04-01 NOTE — Progress Notes (Signed)
   Covid-19 Vaccination Clinic  Name:  Jakylah Bassinger    MRN: [id number] DOB: Dec 07, 1985  04/01/2020  Ms. Ranney was observed post Covid-19 immunization for 15 minutes without incident. She was provided with Vaccine Information Sheet and instruction to access the V-Safe system.   Ms. Basford was instructed to call 911 with any severe reactions post vaccine: Marland Kitchen Difficulty breathing  . Swelling of face and throat  . A fast heartbeat  . A bad rash all over body  . Dizziness and weakness   Immunizations Administered    Name Date Dose VIS Date Route   Pfizer COVID-19 Vaccine 04/01/2020 11:36 AM 0.3 mL 01/09/2019 Intramuscular   Manufacturer: [employer], [employer]   Lot: [id number]   NDC: [address]-4

## 2020-04-22 ENCOUNTER — Ambulatory Visit: Payer: Managed Care, Other (non HMO) | Attending: Internal Medicine

## 2020-04-22 ENCOUNTER — Ambulatory Visit: Payer: Managed Care, Other (non HMO)

## 2020-04-22 DIAGNOSIS — Z23 Encounter for immunization: Secondary | ICD-10-CM

## 2020-04-22 NOTE — Progress Notes (Signed)
   Covid-19 Vaccination Clinic  Name:  Monique Hickman    MRN: [id number] DOB: [birth year]  04/22/2020  [name] was observed post Covid-19 immunization for 30 minutes without incident. She was provided with Vaccine Information Sheet and instruction to access the V-Safe system.   [name] was instructed to call 911 with any severe reactions post vaccine: Marland Kitchen Difficulty breathing  . Swelling of face and throat  . A fast heartbeat  . A bad rash all over body  . Dizziness and weakness   Immunizations Administered    Name Date Dose VIS Date Route   Pfizer COVID-19 Vaccine 04/22/2020 11:51 AM 0.3 mL 01/09/2019 Intramuscular   Manufacturer: [employer], [employer]   Lot: [id number]   NDC: [address]-1

## 2020-11-18 ENCOUNTER — Ambulatory Visit: Payer: Managed Care, Other (non HMO)

## 2020-11-21 ENCOUNTER — Ambulatory Visit: Payer: Managed Care, Other (non HMO) | Admitting: Women's Health

## 2020-11-28 ENCOUNTER — Ambulatory Visit: Payer: Managed Care, Other (non HMO) | Admitting: Certified Nurse Midwife

## 2020-12-09 ENCOUNTER — Ambulatory Visit: Payer: Managed Care, Other (non HMO) | Attending: Internal Medicine

## 2020-12-09 ENCOUNTER — Other Ambulatory Visit (HOSPITAL_BASED_OUTPATIENT_CLINIC_OR_DEPARTMENT_OTHER): Payer: Self-pay | Admitting: Internal Medicine

## 2020-12-09 DIAGNOSIS — Z23 Encounter for immunization: Secondary | ICD-10-CM

## 2020-12-09 MED FILL — PFIZER-BIONTECH COVID-19 VA: 30 | 21 days supply | Qty: 0 | Fill #0

## 2020-12-09 NOTE — Progress Notes (Signed)
   Covid-19 Vaccination Clinic  Name:  Monique Hickman    MRN: [id number] DOB: Dec 08, 1985  12/09/2020  Monique Hickman was observed post Covid-19 immunization for 15 minutes without incident. She was provided with Vaccine Information Sheet and instruction to access the V-Safe system.  Vaccinated by Fredirick Maudlin  Monique Hickman was instructed to call 911 with any severe reactions post vaccine: Marland Kitchen Difficulty breathing  . Swelling of face and throat  . A fast heartbeat  . A bad rash all over body  . Dizziness and weakness   Immunizations Administered    Name Date Dose VIS Date Route   Pfizer COVID-19 Vaccine 12/09/2020  9:18 AM 0.3 mL 09/03/2020 Intramuscular   Manufacturer: [employer], [employer]   Lot: [id number]   NDC: [address]-1

## 2021-08-11 ENCOUNTER — Other Ambulatory Visit: Payer: Self-pay

## 2021-08-11 ENCOUNTER — Ambulatory Visit (INDEPENDENT_AMBULATORY_CARE_PROVIDER_SITE_OTHER): Payer: No Typology Code available for payment source

## 2021-08-11 ENCOUNTER — Other Ambulatory Visit (HOSPITAL_COMMUNITY)
Admission: RE | Admit: 2021-08-11 | Discharge: 2021-08-11 | Disposition: A | Discharge status: Home / Self Care | Payer: No Typology Code available for payment source | Source: Ambulatory Visit

## 2021-08-11 VITALS — BP 114/71 | HR 73 | Ht 65.0 in | Wt 195.0 lb

## 2021-08-11 DIAGNOSIS — N898 Other specified noninflammatory disorders of vagina: Secondary | ICD-10-CM | POA: Diagnosis present

## 2021-08-11 DIAGNOSIS — Z01419 Encounter for gynecological examination (general) (routine) without abnormal findings: Secondary | ICD-10-CM | POA: Insufficient documentation

## 2021-08-11 NOTE — Progress Notes (Signed)
   Subjective:     Monique Hickman is a 35 y.o. female here at [facility] for a routine exam. Current complaints: thick discharge, sometimes itchy. She also wants to discuss possible IUD removal. Patient is concerned about weight gain that she thinks could be linked with IUD. Reports was recently finished school and experienced a lot of stress. Has not been exercising and reports she eats a lot of carbs. She otherwise likes the IUD and does not have any other problems with it. She does not want to become pregnant. Personal health questionnaire reviewed: yes.  Do you have a primary care provider? yes Do you feel safe at home? yes   Health Maintenance Due  Topic Date Due   INFLUENZA VACCINE  06/15/2021     Risk factors for chronic health problems: Smoking: no Alchohol/how much: no Illicit drugs: no Exercise: no Pt BMI: Body mass index is 32.45 kg/m.   Gynecologic History No LMP recorded. Contraception: IUD Sexual health: no concerns Last Pap: 10/30/2019. Results were: normal, negative HRHPV Last mammogram: n/a  Obstetric History OB History  Gravida Para Term Preterm AB Living  3 2 2   1 2   SAB IAB Ectopic Multiple Live Births      1 0 2    # Outcome Date GA Lbr Len/2nd Weight Sex Delivery Anes PTL Lv  3 Term 08/28/15 [redacted]w[redacted]d 03:46 / 00:12 8 lb 11.2 oz (3.946 kg) F Vag-Spont None  LIV  2 Term 07/20/13 [redacted]w[redacted]d  9 lb 1.4 oz (4.122 kg) M Vag-Spont EPI  LIV  1 Ectopic 03/30/02            The following portions of the patient's history were reviewed and updated as appropriate: allergies, current medications, past family history, past medical history, past social history, past surgical history, and problem list.  Review of Systems Pertinent items are noted in HPI.    Objective:   BP 114/71   Pulse 73   Ht 5\' 5"  (1.651 m)   Wt 195 lb (88.5 kg)   BMI 32.45 kg/m  VS reviewed, nursing note reviewed,  Constitutional: well developed, well nourished, no  distress HEENT: normocephalic CV: normal rate Pulm/chest wall: normal effort Breast Exam:  Performed: right breast normal without mass, skin or nipple changes or axillary nodes, left breast normal without mass, skin or nipple changes or axillary nodes Abdomen: soft Neuro: alert and oriented x 3 Skin: warm, dry Psych: affect normal Pelvic exam: Performed: Cervix pink, visually closed, without lesion, scant white creamy discharge, vaginal walls and external genitalia normal Bimanual exam: Cervix 0/long/high, firm, anterior, neg CMT, uterus nontender, nonenlarged, adnexa without tenderness, enlargement, or mass      Assessment/Plan:   1. Well woman exam - Patient up to date on pap, however is requesting pap smear today - We discussed potential causes of weight gain (I.e stress, lack of exercise, diet, etc.) along with other birth control options at length considering patient does not want to become pregnant. Patient opts to keep IUD in place and try incorporating exercise, limiting carb intake, and reducing stressors before possibly removing IUD. Patient will contact office for IUD removal if diet/exercise do not help with weight gain.    Follow up in: 1  year  or as needed.    04/01/02, CNM 08/11/21 10:59 AM

## 2021-08-11 NOTE — Progress Notes (Signed)
Pt would like IUD removed and would like to discuss birth control options Pt c/o recurrent yeast infections

## 2021-08-12 LAB — CERVICOVAGINAL ANCILLARY ONLY
Bacterial Vaginitis (gardnerella): NEGATIVE
Candida Glabrata: NEGATIVE
Candida Vaginitis: POSITIVE — AB
Comment: NEGATIVE
Comment: NEGATIVE
Comment: NEGATIVE

## 2021-08-13 ENCOUNTER — Telehealth: Payer: Self-pay | Admitting: *Deleted

## 2021-08-13 LAB — CYTOLOGY - PAP
Comment: NEGATIVE
Diagnosis: NEGATIVE
Diagnosis: REACTIVE
High risk HPV: NEGATIVE

## 2021-08-13 MED ORDER — FLUCONAZOLE 150 MG PO TABS: 150.0000 mg | ORAL_TABLET | Freq: Once | ORAL | 1 refills | Status: AC

## 2021-08-13 NOTE — Telephone Encounter (Signed)
-----   Message from Brand Males, CNM sent at 08/13/2021  9:04 AM EDT ----- Patient has yeast infection. Can you please send in 1 tab Diflucan. Patient may have 1 refill and repeat in 3 days prn.  Thanks!

## 2021-08-13 NOTE — Telephone Encounter (Signed)
Pt notified of positive yeast on Aptima swab.  Per D Simpson,CNM Diflucan 150 mg sent to [facility] [place] in [place].

## 2022-02-12 ENCOUNTER — Ambulatory Visit: Payer: No Typology Code available for payment source | Admitting: Obstetrics and Gynecology

## 2022-02-26 ENCOUNTER — Ambulatory Visit: Payer: PRIVATE HEALTH INSURANCE | Admitting: Women's Health

## 2022-06-17 ENCOUNTER — Encounter: Payer: Self-pay | Admitting: Nurse Practitioner

## 2022-06-22 ENCOUNTER — Encounter: Payer: Self-pay | Admitting: Obstetrics & Gynecology

## 2022-07-23 ENCOUNTER — Encounter: Payer: Self-pay | Admitting: Radiology

## 2022-07-26 ENCOUNTER — Encounter: Payer: Self-pay | Admitting: Radiology

## 2022-07-26 ENCOUNTER — Other Ambulatory Visit: Payer: Self-pay | Admitting: Nurse Practitioner

## 2022-07-26 ENCOUNTER — Ambulatory Visit: Payer: Self-pay

## 2022-07-26 ENCOUNTER — Ambulatory Visit (INDEPENDENT_AMBULATORY_CARE_PROVIDER_SITE_OTHER): Payer: No Typology Code available for payment source | Admitting: Radiology

## 2022-07-26 VITALS — BP 124/80 | Ht 64.0 in | Wt 190.0 lb

## 2022-07-26 DIAGNOSIS — Z308 Encounter for other contraceptive management: Secondary | ICD-10-CM

## 2022-07-26 DIAGNOSIS — Z01419 Encounter for gynecological examination (general) (routine) without abnormal findings: Secondary | ICD-10-CM | POA: Diagnosis not present

## 2022-07-26 DIAGNOSIS — Z30432 Encounter for removal of intrauterine contraceptive device: Secondary | ICD-10-CM | POA: Diagnosis not present

## 2022-07-26 DIAGNOSIS — M25531 Pain in right wrist: Secondary | ICD-10-CM

## 2022-07-26 DIAGNOSIS — N898 Other specified noninflammatory disorders of vagina: Secondary | ICD-10-CM | POA: Diagnosis not present

## 2022-07-26 LAB — WET PREP FOR TRICH, YEAST, CLUE

## 2022-07-26 MED ORDER — FLUCONAZOLE 150 MG PO TABS: 150.0000 mg | ORAL_TABLET | ORAL | 0 refills | Status: DC

## 2022-07-26 MED ORDER — PHEXXI 1.8-1-0.4 % VA GEL: 5.0000 g | Freq: Every day | VAGINAL | 11 refills | Status: DC

## 2022-07-26 NOTE — Progress Notes (Signed)
Monique Hickman [facility] 1986/09/10 [id number]   History:  36 y.o. G3P2 presents for annual exam as a new patient. Works as Charity fundraiser at [employer]. Would like IUD removed, thinks it has caused weight gain and recurrent vaginal infections.   Gynecologic History No LMP recorded (lmp unknown). Period Pattern: (!) Irregular Menstrual Flow: Light Menstrual Control: Panty liner Dysmenorrhea: None Contraception/Family planning: IUD Sexually active: yes Last Pap: 07/2021. Results were: normal   Obstetric History OB History  Gravida Para Term Preterm AB Living  3 2 2   1 2   SAB IAB Ectopic Multiple Live Births      1 0 2    # Outcome Date GA Lbr Len/2nd Weight Sex Delivery Anes PTL Lv  3 Term 08/28/15 [redacted]w[redacted]d 03:46 / 00:12 8 lb 11.2 oz (3.946 kg) F Vag-Spont None  LIV  2 Term 07/20/13 [redacted]w[redacted]d  9 lb 1.4 oz (4.122 kg) M Vag-Spont EPI  LIV  1 Ectopic 03/30/02             The following portions of the patient's history were reviewed and updated as appropriate: allergies, current medications, past family history, past medical history, past social history, past surgical history, and problem list.  Review of Systems Pertinent items noted in HPI and remainder of comprehensive ROS otherwise negative.   Past medical history, past surgical history, family history and social history were all reviewed and documented in the EPIC chart.   Exam:  Vitals:   07/26/22 1406  BP: 124/80  Weight: 190 lb (86.2 kg)  Height: 5\' 4"  (1.626 m)   Body mass index is 32.61 kg/m.  General appearance:  Normal Thyroid:  Symmetrical, normal in size, without palpable masses or nodularity. Respiratory  Auscultation:  Clear without wheezing or rhonchi Cardiovascular  Auscultation:  Regular rate, without rubs, murmurs or gallops  Edema/varicosities:  Not grossly evident Abdominal  Soft,nontender, without masses, guarding or rebound.  Liver/spleen:  No organomegaly noted  Hernia:  None appreciated  Skin  Inspection:   Grossly normal Breasts: Examined lying and sitting.   Right: Without masses, retractions, nipple discharge or axillary adenopathy.   Left: Without masses, retractions, nipple discharge or axillary adenopathy. Genitourinary   Inguinal/mons:  Normal without inguinal adenopathy  External genitalia:  Normal appearing vulva with no masses, tenderness, or lesions  BUS/Urethra/Skene's glands:  Normal without masses or exudate  Vagina:  Normal appearing with normal color and discharge, no lesions  Cervix:  Normal appearing without discharge or lesions. IUD grasps with forceps and removed without difficulty. Pt tolerated procedure well.   Uterus:  Normal in size, shape and contour.  Mobile, nontender  Adnexa/parametria:     Rt: Normal in size, without masses or tenderness.   Lt: Normal in size, without masses or tenderness.  Anus and perineum: Normal  09/25/22, CMA present for exam and IUD removal  Assessment/Plan:   1. Well woman exam with routine gynecological exam Pap due 2027  2. Vaginal discharge +yeast, rx sent for diflucan - WET PREP FOR TRICH, YEAST, CLUE  3. Encounter for IUD removal  4. Encounter for other contraceptive management Desires non hormonal method - Lactic Ac-Citric Ac-Pot Bitart (PHEXXI) 1.8-1-0.4 % GEL; Place 5 g vaginally daily. As needed up to 1 hour before intercourse  Dispense: 120 g; Refill: 11    Discussed SBE, mammo at 41, pap screening as directed/appropriate. Recommend Rosalyn Charters of exercise weekly, including weight bearing exercise. Encouraged the use of seatbelts and sunscreen. Return in 1 year for  annual or as needed.   Arlie Solomons B WHNP-BC 2:24 PM 07/26/2022

## 2022-08-06 ENCOUNTER — Telehealth: Payer: Self-pay | Admitting: *Deleted

## 2022-08-06 NOTE — Telephone Encounter (Signed)
PA done via cover my meds for Phexxi gel. Medication approved by [employer] from 08/05/2022-08/05/2023.

## 2023-01-18 ENCOUNTER — Other Ambulatory Visit: Payer: Self-pay | Admitting: Family Medicine

## 2023-01-18 DIAGNOSIS — L680 Hirsutism: Secondary | ICD-10-CM

## 2023-02-11 ENCOUNTER — Other Ambulatory Visit: Payer: PRIVATE HEALTH INSURANCE

## 2023-03-30 ENCOUNTER — Ambulatory Visit
Admission: RE | Admit: 2023-03-30 | Discharge: 2023-03-30 | Disposition: A | Discharge status: Home / Self Care | Payer: Medicaid Other | Source: Ambulatory Visit | Attending: Family Medicine

## 2023-03-30 DIAGNOSIS — L680 Hirsutism: Secondary | ICD-10-CM

## 2023-12-27 ENCOUNTER — Encounter (HOSPITAL_COMMUNITY): Payer: Self-pay | Admitting: Obstetrics and Gynecology

## 2023-12-27 ENCOUNTER — Other Ambulatory Visit: Payer: Self-pay

## 2023-12-27 ENCOUNTER — Inpatient Hospital Stay (HOSPITAL_COMMUNITY)
Admission: EM | Admit: 2023-12-27 | Discharge: 2023-12-27 | Disposition: A | Discharge status: Home / Self Care | Payer: Medicaid Other | Attending: Obstetrics and Gynecology | Admitting: Obstetrics and Gynecology

## 2023-12-27 DIAGNOSIS — R1010 Upper abdominal pain, unspecified: Secondary | ICD-10-CM | POA: Diagnosis present

## 2023-12-27 DIAGNOSIS — R140 Abdominal distension (gaseous): Secondary | ICD-10-CM | POA: Diagnosis not present

## 2023-12-27 DIAGNOSIS — R141 Gas pain: Secondary | ICD-10-CM

## 2023-12-27 DIAGNOSIS — Z3490 Encounter for supervision of normal pregnancy, unspecified, unspecified trimester: Secondary | ICD-10-CM

## 2023-12-27 LAB — URINALYSIS, ROUTINE W REFLEX MICROSCOPIC
Bilirubin Urine: NEGATIVE
Glucose, UA: NEGATIVE mg/dL
Hgb urine dipstick: NEGATIVE
Ketones, ur: NEGATIVE mg/dL
Leukocytes,Ua: NEGATIVE
Nitrite: NEGATIVE
Protein, ur: NEGATIVE mg/dL
Specific Gravity, Urine: 1.016 (ref 1.005–1.030)
pH: 5 (ref 5.0–8.0)

## 2023-12-27 MED ORDER — CIMETIDINE 200 MG PO TABS: 200.0000 mg | ORAL_TABLET | Freq: Two times a day (BID) | ORAL | Status: DC

## 2023-12-27 MED ORDER — PRENATAL 27-0.8 MG PO TABS: 1.0000 | ORAL_TABLET | Freq: Every day | ORAL | 3 refills | Status: DC

## 2023-12-27 MED ORDER — SIMETHICONE 40 MG PO STRP: 40.0000 mg | ORAL_STRIP | Freq: Two times a day (BID) | ORAL | Status: DC | PRN

## 2023-12-27 MED ORDER — DOCUSATE SODIUM 100 MG PO CAPS: 100.0000 mg | ORAL_CAPSULE | Freq: Two times a day (BID) | ORAL | Status: DC

## 2023-12-27 NOTE — MAU Provider Note (Signed)
MAU Provider Note  History    Chief Complaint  Patient presents with   Abdominal Pain   Pt reports upper abdominal pain with bloating. This has been intermittent for the last week. She does feel some relief after passing flatus and having BM, but discomfort eventually returns. Denies N/V/C/D. No VB. She has been worried to try any OTC medications for relief as she is unsure what is safe to take. She also reports she has 2 older children both diagnosed with Autism, and she is concerned about taking anything that could harm this pregnancy. She recently presented to urgent care for similar and found out she was pregnant. She has not connected with PNC yet; unsure when LMP was but states approximately 4 weeks ago, though lighter and shorter in duration than her usual periods. She has an initial OB appt scheduled for mid-March.  Abdominal Pain Pertinent negatives include no constipation, diarrhea, dysuria, fever, nausea or vomiting.    OB History     Gravida  4   Para  2   Term  2   Preterm      AB  1   Living  2      SAB      IAB      Ectopic  1   Multiple  0   Live Births  2           Past Medical History:  Diagnosis Date   Ectopic pregnancy 2003   Lapt      Past Surgical History:  Procedure Laterality Date   LAPAROTOMY     Removal of tube    Family History  Problem Relation Age of Onset   Breast cancer Mother    Prostate cancer Father     Social History   Tobacco Use   Smoking status: Never   Smokeless tobacco: Never  Vaping Use   Vaping status: Never Used  Substance Use Topics   Alcohol use: No   Drug use: No    Allergies: No Known Allergies  Medications Prior to Admission  Medication Sig Dispense Refill Last Dose/Taking   fluconazole (DIFLUCAN) 150 MG tablet Take 1 tablet (150 mg total) by mouth every 3 (three) days. 2 tablet 0    Lactic Ac-Citric Ac-Pot Bitart (PHEXXI) 1.8-1-0.4 % GEL Place 5 g vaginally daily. As needed up to 1 hour  before intercourse 120 g 11    levonorgestrel (MIRENA) 20 MCG/24HR IUD 1 each by Intrauterine route once.       Review of Systems  Constitutional:  Negative for appetite change, fatigue and fever.  Respiratory:  Negative for chest tightness and shortness of breath.   Cardiovascular:  Negative for chest pain.  Gastrointestinal:  Positive for abdominal pain. Negative for constipation, diarrhea, nausea and vomiting.  Genitourinary:  Negative for dysuria and vaginal bleeding.  Neurological:  Negative for dizziness and light-headedness.   Physical Exam Blood pressure 114/60, pulse 91, temperature 98.2 F (36.8 C), temperature source Oral, resp. rate 18, height 5\' 3"  (1.6 m), weight 90.8 kg, SpO2 100%. Physical Exam Vitals reviewed.  Constitutional:      General: She is not in acute distress.    Appearance: She is well-developed. She is not ill-appearing.  HENT:     Head: Normocephalic.  Eyes:     Extraocular Movements: Extraocular movements intact.  Pulmonary:     Effort: Pulmonary effort is normal.  Abdominal:     General: Abdomen is flat. There is no distension.  Palpations: Abdomen is soft.  Skin:    General: Skin is warm and dry.  Neurological:     General: No focal deficit present.     Mental Status: She is alert.  Psychiatric:        Mood and Affect: Mood normal.        Behavior: Behavior normal.     MAU Course Procedures  MDM  1015 - pt seen in family room and history collected. Initial concern for ectopic, especially given pt history of prior ectopic, however pt description of upper abdominal pain relieved with passing flatus and BM is reassuring. Most likely indigestion, gas, and possible constipation component. Discussed with pt and she is in agreement to discharge home with list of pregnancy-safe medications she may obtain OTC. Confirmed initial OB appt scheduled with Dr. Para March 01/30/24. Return precautions given.  Cyndia Skeeters, DO [facility],  PGY-1 12/27/23 10:33 AM

## 2023-12-27 NOTE — MAU Note (Signed)
Monique Hickman is a 38 y.o. at Unknown here in MAU reporting: she's having abdominal pain that began last week.  Reports the pain varies between lower and upper abdominal pain, reports feels like pressure or bloating.  States today pain is in the upper abdomen.  States pain is relieved when has gas.  Denies VB.  LMP: Unsure January 2025 Onset of complaint: last week Pain score: 5 Vitals:   12/27/23 0942  BP: 114/60  Pulse: 91  Resp: 18  Temp: 98.2 F (36.8 C)  SpO2: 100%     FHT: NA  Lab orders placed from triage: UA

## 2023-12-27 NOTE — Discharge Instructions (Signed)
Safe Medications in Pregnancy   Acne:  Benzoyl Peroxide  Salicylic Acid   Backache/Headache:  Tylenol: 2 regular strength every 4 hours OR               2 Extra strength every 6 hours   Colds/Coughs/Allergies:  Benadryl (alcohol free) 25 mg every 6 hours as needed  Breath right strips  Claritin  Cepacol throat lozenges  Chloraseptic throat spray  Cold-Eeze- up to three times per day  Cough drops, alcohol free  Flonase (by prescription only)  Guaifenesin  Mucinex  Robitussin DM (plain only, alcohol free)  Saline nasal spray/drops  Sudafed (pseudoephedrine) & Actifed * use only after [redacted] weeks gestation and if you do not have high blood pressure  Tylenol  Vicks Vaporub  Zinc lozenges  Zyrtec   Constipation:  Colace  Ducolax suppositories  Fleet enema  Glycerin suppositories  Metamucil  Milk of magnesia  Miralax  Senokot  Smooth move tea   Diarrhea:  Kaopectate  Imodium A-D   *DO NOT USE Pepto Bismol   Hemorrhoids:  Anusol  Anusol HC  Preparation H  Tucks   Indigestion:  Tums  Maalox  Mylanta  Zantac  Pepcid   Insomnia:  Benadryl (alcohol free) 25mg  every 6 hours as needed  Tylenol PM  Unisom, no Gelcaps   Leg Cramps:  Tums  MagGel   Nausea/Vomiting:  Bonine  Dramamine  Emetrol  Ginger extract  Sea bands  Meclizine  Nausea medication to take during pregnancy:  Unisom (doxylamine succinate 25 mg tablets) Take one tablet daily at bedtime. If symptoms are not adequately controlled, the dose can be increased to a maximum recommended dose of two tablets daily (1/2 tablet in the morning, 1/2 tablet mid-afternoon and one at bedtime).  Vitamin B6 100mg  tablets. Take one tablet twice a day (up to 200 mg per day).   Skin Rashes:  Aveeno products  Benadryl cream or 25mg  every 6 hours as needed  Calamine Lotion  1% cortisone cream   Yeast infection:  Gyne-lotrimin 7  Monistat 7    ** If taking multiple medications, please check labels to avoid  duplicating the same active ingredients  ** Take medication as directed on the label  ** Do not exceed 4000 mg of tylenol in 24 hours  ** Do not take medications that contain aspirin or ibuprofen

## 2024-01-08 ENCOUNTER — Other Ambulatory Visit: Payer: Self-pay

## 2024-01-08 ENCOUNTER — Inpatient Hospital Stay (HOSPITAL_COMMUNITY): Payer: Medicaid Other

## 2024-01-08 ENCOUNTER — Inpatient Hospital Stay (HOSPITAL_COMMUNITY)
Admission: AD | Admit: 2024-01-08 | Discharge: 2024-01-08 | Disposition: A | Discharge status: Home / Self Care | Payer: Medicaid Other | Attending: Obstetrics & Gynecology | Admitting: Obstetrics & Gynecology

## 2024-01-08 DIAGNOSIS — Z3A01 Less than 8 weeks gestation of pregnancy: Secondary | ICD-10-CM | POA: Insufficient documentation

## 2024-01-08 DIAGNOSIS — R1084 Generalized abdominal pain: Secondary | ICD-10-CM | POA: Diagnosis present

## 2024-01-08 DIAGNOSIS — O418X10 Other specified disorders of amniotic fluid and membranes, first trimester, not applicable or unspecified: Secondary | ICD-10-CM | POA: Insufficient documentation

## 2024-01-08 DIAGNOSIS — O468X1 Other antepartum hemorrhage, first trimester: Secondary | ICD-10-CM | POA: Insufficient documentation

## 2024-01-08 DIAGNOSIS — D219 Benign neoplasm of connective and other soft tissue, unspecified: Secondary | ICD-10-CM

## 2024-01-08 DIAGNOSIS — D259 Leiomyoma of uterus, unspecified: Secondary | ICD-10-CM | POA: Diagnosis not present

## 2024-01-08 DIAGNOSIS — O3411 Maternal care for benign tumor of corpus uteri, first trimester: Secondary | ICD-10-CM | POA: Diagnosis not present

## 2024-01-08 DIAGNOSIS — R109 Unspecified abdominal pain: Secondary | ICD-10-CM | POA: Diagnosis not present

## 2024-01-08 DIAGNOSIS — O0911 Supervision of pregnancy with history of ectopic or molar pregnancy, first trimester: Secondary | ICD-10-CM | POA: Insufficient documentation

## 2024-01-08 DIAGNOSIS — Z3201 Encounter for pregnancy test, result positive: Secondary | ICD-10-CM | POA: Diagnosis present

## 2024-01-08 LAB — COMPREHENSIVE METABOLIC PANEL
ALT: 23 U/L (ref 0–44)
AST: 20 U/L (ref 15–41)
Albumin: 3.7 g/dL (ref 3.5–5.0)
Alkaline Phosphatase: 41 U/L (ref 38–126)
Anion gap: 8 (ref 5–15)
BUN: 5 mg/dL — ABNORMAL LOW (ref 6–20)
CO2: 22 mmol/L (ref 22–32)
Calcium: 9 mg/dL (ref 8.9–10.3)
Chloride: 104 mmol/L (ref 98–111)
Creatinine, Ser: 0.93 mg/dL (ref 0.44–1.00)
GFR, Estimated: >60 mL/min (ref >60)
Glucose, Bld: 136 mg/dL — ABNORMAL HIGH (ref 70–99)
Potassium: 3.7 mmol/L (ref 3.5–5.1)
Sodium: 134 mmol/L — ABNORMAL LOW (ref 135–145)
Total Bilirubin: 0.6 mg/dL (ref 0.0–1.2)
Total Protein: 7.5 g/dL (ref 6.5–8.1)

## 2024-01-08 LAB — CBC
HCT: 39.8 % (ref 36.0–46.0)
Hemoglobin: 13.1 g/dL (ref 12.0–15.0)
MCH: 29 pg (ref 26.0–34.0)
MCHC: 32.9 g/dL (ref 30.0–36.0)
MCV: 88.1 fL (ref 80.0–100.0)
Platelets: 246 10*3/uL (ref 150–400)
RBC: 4.52 MIL/uL (ref 3.87–5.11)
RDW: 13.8 % (ref 11.5–15.5)
WBC: 7.2 10*3/uL (ref 4.0–10.5)
nRBC: 0 % (ref 0.0–0.2)

## 2024-01-08 LAB — URINALYSIS, ROUTINE W REFLEX MICROSCOPIC
Bacteria, UA: NONE SEEN
Bilirubin Urine: NEGATIVE
Glucose, UA: NEGATIVE mg/dL
Hgb urine dipstick: NEGATIVE
Ketones, ur: NEGATIVE mg/dL
Leukocytes,Ua: NEGATIVE
Nitrite: NEGATIVE
Protein, ur: NEGATIVE mg/dL
Specific Gravity, Urine: 1.025 (ref 1.005–1.030)
pH: 5 (ref 5.0–8.0)

## 2024-01-08 LAB — TYPE AND SCREEN
ABO/RH(D): O POS
Antibody Screen: NEGATIVE

## 2024-01-08 LAB — HCG, QUANTITATIVE, PREGNANCY: hCG, Beta Chain, Quant, S: 92208 m[IU]/mL — ABNORMAL HIGH (ref <5)

## 2024-01-08 NOTE — Discharge Instructions (Signed)

## 2024-01-08 NOTE — MAU Provider Note (Signed)
 History    CSN: [id number]   Arrival date and time: 01/08/24 4010    Event Date/Time   First Provider Initiated Contact with Patient 01/08/24 [phone]          Chief Complaint  Patient presents with   Abdominal Pain      Monique Hickman is a 38 y.o. [id number] at Unknown.  Patient states her LMP was either Dec or Jan, but she is unsure because she does not track it.  She presents today for abdominal pain.  Patient states the pain has been ongoing and she was seen last week for similar symptoms.  However, she reports the pain has worsened in the past 2 days and became it's worse last night.  She reports the pain is"all over" her abdomen and describes it as cramping that is worsened when laying on her side.  She denies improving factors.  Patient also reports watery diarrhea and vomiting this morning.  No issues with urination or vaginal concerns.  No recent sexual activity.    OB History       Gravida  4   Para  2   Term  2   Preterm      AB  1   Living  2        SAB      IAB      Ectopic  1   Multiple  0   Live Births  2                   Past Medical History:  Diagnosis Date   Ectopic pregnancy 2003    Lapt               Past Surgical History:  Procedure Laterality Date   LAPAROTOMY        Removal of tube               Family History  Problem Relation Age of Onset   Breast cancer Mother     Prostate cancer Father            Social History  Social History        Tobacco Use   Smoking status: Never   Smokeless tobacco: Never  Vaping Use   Vaping status: Never Used  Substance Use Topics   Alcohol use: No   Drug use: No        Allergies:  Allergies  No Known Allergies            Medications Prior to Admission  Medication Sig Dispense Refill Last Dose/Taking   cimetidine (CIMETIDINE 200) 200 MG tablet Take 1 tablet (200 mg total) by mouth 2 (two) times daily.         docusate sodium (COLACE) 100 MG capsule Take 1 capsule  (100 mg total) by mouth 2 (two) times daily.         Prenatal Vit-Fe Fumarate-FA (MULTIVITAMIN-PRENATAL) 27-0.8 MG TABS tablet Take 1 tablet by mouth daily at 12 noon. 30 tablet 3     Simethicone 40 MG STRP Take 1 strip (40 mg total) by mouth 2 (two) times daily as needed.                Review of Systems  Genitourinary:  Negative for difficulty urinating, dysuria, vaginal bleeding and vaginal discharge.    Physical Exam    Blood pressure 119/62, pulse 85, temperature 98.9 F (37.2 C), temperature source Oral, resp. rate 19, height 5\' 3"  (1.6  m), weight 90.4 kg, SpO2 100%.   Physical Exam Vitals and nursing note reviewed. Chaperone present: Madison, Charity fundraiser.  Constitutional:      General: She is in acute distress.     Appearance: Normal appearance. She is well-developed.  Eyes:     Conjunctiva/sclera: Conjunctivae normal.  Cardiovascular:     Rate and Rhythm: Normal rate.  Pulmonary:     Effort: Pulmonary effort is normal. No respiratory distress.  Abdominal:     Tenderness: There is abdominal tenderness.  Musculoskeletal:        General: Normal range of motion.     Cervical back: Normal range of motion.  Skin:    General: Skin is warm and dry.  Neurological:     Mental Status: She is alert.  Psychiatric:        Mood and Affect: Mood normal.        Behavior: Behavior normal.        MAU Course  Procedures Lab Results Last 24 Hours  No results found for this or any previous visit (from the past 24 hours).   No results found.   MDM Physical Exam Labs: UA, UPT, CBC, CMP, hCG, T&S Ultrasound Assessment and Plan  38 year old  [id number] at Unknown [place] Abdominal Pain H/O Ectopic Pregnancy   -POC Reviewed. -Patient offered and declines pain medication. -Patient also denies vaginal cultures.  -Labs ordered. -Will send for [place] and await results.    Cherre Robins 01/08/2024, 7:06 AM   - Transfer of care to S. Suzie Portela CNM at 0800 -  Results for orders placed or performed  during the hospital encounter of 01/08/24 (from the past 24 hours)  Type and screen [facility]     Status: None   Collection Time: 01/08/24  7:15 AM  Result Value Ref Range   ABO/RH(D) O POS    Antibody Screen NEG    Sample Expiration      01/11/2024,2359 Performed at [facility], [address], [place], [place] [address]   CBC     Status: None   Collection Time: 01/08/24  7:18 AM  Result Value Ref Range   WBC 7.2 4.0 - 10.5 K/uL   RBC 4.52 3.87 - 5.11 MIL/uL   Hemoglobin 13.1 12.0 - 15.0 g/dL   HCT 66.4 40.3 - 47.4 %   MCV 88.1 80.0 - 100.0 fL   MCH 29.0 26.0 - 34.0 pg   MCHC 32.9 30.0 - 36.0 g/dL   RDW 25.9 56.3 - 87.5 %   Platelets 246 150 - 400 K/uL   nRBC 0.0 0.0 - 0.2 %  hCG, quantitative, pregnancy     Status: Abnormal   Collection Time: 01/08/24  7:18 AM  Result Value Ref Range   hCG, Beta Chain, Quant, S 92,208 (H) <5 mIU/mL  Comprehensive metabolic panel     Status: Abnormal   Collection Time: 01/08/24  7:18 AM  Result Value Ref Range   Sodium 134 (L) 135 - 145 mmol/L   Potassium 3.7 3.5 - 5.1 mmol/L   Chloride 104 98 - 111 mmol/L   CO2 22 22 - 32 mmol/L   Glucose, Bld 136 (H) 70 - 99 mg/dL   BUN 5 (L) 6 - 20 mg/dL   Creatinine, Ser 6.43 0.44 - 1.00 mg/dL   Calcium 9.0 8.9 - 32.9 mg/dL   Total Protein 7.5 6.5 - 8.1 g/dL   Albumin 3.7 3.5 - 5.0 g/dL   AST 20  15 - 41 U/L   ALT 23 0 - 44 U/L   Alkaline Phosphatase 41 38 - 126 U/L   Total Bilirubin 0.6 0.0 - 1.2 mg/dL   GFR, Estimated >02 >72 mL/min   Anion gap 8 5 - 15  Urinalysis, Routine w reflex microscopic -Urine, Clean Catch     Status: Abnormal   Collection Time: 01/08/24  7:19 AM  Result Value Ref Range   Color, Urine YELLOW YELLOW   APPearance TURBID (A) CLEAR   Specific Gravity, Urine 1.025 1.005 - 1.030   pH 5.0 5.0 - 8.0   Glucose, UA NEGATIVE NEGATIVE mg/dL   Hgb urine dipstick NEGATIVE NEGATIVE   Bilirubin Urine NEGATIVE NEGATIVE   Ketones, ur NEGATIVE NEGATIVE  mg/dL   Protein, ur NEGATIVE NEGATIVE mg/dL   Nitrite NEGATIVE NEGATIVE   Leukocytes,Ua NEGATIVE NEGATIVE   RBC / HPF 0-5 0 - 5 RBC/hpf   WBC, UA 0-5 0 - 5 WBC/hpf   Bacteria, UA NONE SEEN NONE SEEN   Squamous Epithelial / HPF 6-10 0 - 5 /HPF   Mucus PRESENT    US OB LESS THAN 14 WEEKS WITH OB TRANSVAGINAL Result Date: 01/08/2024 CLINICAL DATA:  First trimester of pregnancy, abdominal pain. EXAM: OBSTETRIC <14 WK [place] AND TRANSVAGINAL OB US TECHNIQUE: Both transabdominal and transvaginal ultrasound examinations were performed for complete evaluation of the gestation as well as the maternal uterus, adnexal regions, and pelvic cul-de-sac. Transvaginal technique was performed to assess early pregnancy. COMPARISON:  Mar 30, 2023. FINDINGS: Intrauterine gestational sac: Single Yolk sac:  Visualized. Embryo:  Visualized. Cardiac Activity: Probable fetal cardiac activity visualized, but heart rate could not be measured due to small size. CRL:  3.4 mm   6 w   0 d                  [place] EDC: September 02, 2024. Subchorionic hemorrhage: Moderate size subchorionic hemorrhage is noted measuring 16.3 x 10.0 x 9.0 cm. Maternal uterus/adnexae: Probable corpus luteum cyst is noted in left ovary. Right ovary is not clearly visualized. Probable uterine fibroid noted in fundus. IMPRESSION: Probable early intrauterine gestational sac with yolk sac and probable fetal pole, as well as possible fetal cardiac activity, although heart rate could not be determined due to small size. Moderate size subchorionic hemorrhage is noted. Recommend follow-up quantitative B-HCG levels and follow-up US in 14 days to assess viability. This recommendation follows SRU consensus guidelines: Diagnostic Criteria for Nonviable Pregnancy Early in the First Trimester. Malva Limes Med 2013; 536:6440-34. Electronically Signed   By: Lupita Raider M.D.   On: 01/08/2024 08:45   - WBC normal, low suspicion for infection  - H&H normal. Patient is hemodynamically  stable.  - Single living IUP, measuring about 6 weeks 0 days gestation.  - Recommended to follow up with OBGYN of her choosing for Prenatal care.  - Plan for discharge.   1. Abdominal pain, unspecified abdominal location   2. Subchorionic hematoma in first trimester, single or unspecified fetus   3. Fibroids   4. [redacted] weeks gestation of pregnancy    - Reviewed IUP and recommendation for OB care.  - Patient has a NOB scheduled for 3/17. Encouraged to keep appt.  - Discussed fibroids and pain expectations in pregnancy. Patient reports that she was aware of fibroids fro ma previous pregnancy.  - Reviewed comfort measures and safe medications in pregnancy with Patient and FOB.  - Reviewed worsening signs and return precautions.  -  Patient discharged home in stable condition and may return to MAU as needed.   Loyde Orth Danella Deis) Suzie Portela, MSN, CNM  [facility]  01/08/2024 9:31 AM

## 2024-01-08 NOTE — MAU Provider Note (Incomplete)
 History     CSN: [id number]  Arrival date and time: 01/08/24 5188   Event Date/Time   First Provider Initiated Contact with Patient 01/08/24 [phone]      Chief Complaint  Patient presents with   Abdominal Pain    Monique Hickman is a 38 y.o. [id number] at Unknown.  Patient states her LMP was either Dec or Jan, but she is unsure because she does not track it.  She presents today for abdominal pain.  Patient states the pain has been ongoing and she was seen last week for similar symptoms.  However, she reports the pain has worsened in the past 2 days and became it's worse last night.  She reports the pain is"all over" her abdomen and describes it as cramping that is worsened when laying on her side.  She denies improving factors.  Patient also reports watery diarrhea and vomiting this morning.  No issues with urination or vaginal concerns.  No recent sexual activity.   OB History     Gravida  4   Para  2   Term  2   Preterm      AB  1   Living  2      SAB      IAB      Ectopic  1   Multiple  0   Live Births  2           Past Medical History:  Diagnosis Date   Ectopic pregnancy 2003   Lapt      Past Surgical History:  Procedure Laterality Date   LAPAROTOMY     Removal of tube    Family History  Problem Relation Age of Onset   Breast cancer Mother    Prostate cancer Father     Social History   Tobacco Use   Smoking status: Never   Smokeless tobacco: Never  Vaping Use   Vaping status: Never Used  Substance Use Topics   Alcohol use: No   Drug use: No    Allergies: No Known Allergies  Medications Prior to Admission  Medication Sig Dispense Refill Last Dose/Taking   cimetidine (CIMETIDINE 200) 200 MG tablet Take 1 tablet (200 mg total) by mouth 2 (two) times daily.      docusate sodium (COLACE) 100 MG capsule Take 1 capsule (100 mg total) by mouth 2 (two) times daily.      Prenatal Vit-Fe Fumarate-FA (MULTIVITAMIN-PRENATAL) 27-0.8 MG TABS  tablet Take 1 tablet by mouth daily at 12 noon. 30 tablet 3    Simethicone 40 MG STRP Take 1 strip (40 mg total) by mouth 2 (two) times daily as needed.       Review of Systems  Genitourinary:  Negative for difficulty urinating, dysuria, vaginal bleeding and vaginal discharge.   Physical Exam   Blood pressure 119/62, pulse 85, temperature 98.9 F (37.2 C), temperature source Oral, resp. rate 19, height 5\' 3"  (1.6 m), weight 90.4 kg, SpO2 100%.  Physical Exam Vitals and nursing note reviewed. Chaperone present: Madison, Charity fundraiser.  Constitutional:      General: She is in acute distress.     Appearance: Normal appearance. She is well-developed.  Eyes:     Conjunctiva/sclera: Conjunctivae normal.  Cardiovascular:     Rate and Rhythm: Normal rate.  Pulmonary:     Effort: Pulmonary effort is normal. No respiratory distress.  Abdominal:     Tenderness: There is abdominal tenderness.  Musculoskeletal:  General: Normal range of motion.     Cervical back: Normal range of motion.  Skin:    General: Skin is warm and dry.  Neurological:     Mental Status: She is alert.  Psychiatric:        Mood and Affect: Mood normal.        Behavior: Behavior normal.     MAU Course  Procedures No results found for this or any previous visit (from the past 24 hours). No results found.  MDM Physical Exam Labs: UA, UPT, CBC, CMP, hCG, T&S Ultrasound Assessment and Plan  38 year old  [id number] at Unknown [place] Abdominal Pain H/O Ectopic Pregnancy  -POC Reviewed. -Patient offered and declines pain medication. -Patient also denies vaginal cultures.  -Labs ordered. -Will send for [place] and await results.   Cherre Robins 01/08/2024, 7:06 AM

## 2024-01-08 NOTE — MAU Note (Signed)
.  Monique Hickman is a 38 y.o. at Unknown here in MAU reporting: pain over entire abdomen that is intermittent. States she had an ectopic pregnancy before and it feels similar to that. Reports pain will be relieved when she passes gas sometimes. Also having watery stools and emesis x1 this morning. Denies taking medication for pain. Denies VB or abnormal discharge.    LMP: unsure  Onset of complaint: 2 days Pain score: 7 Vitals:   01/08/24 0658  BP: 119/62  Pulse: 85  Resp: 19  Temp: 98.9 F (37.2 C)  SpO2: 100%     FHT: NA   Lab orders placed from triage: UA

## 2024-01-26 DIAGNOSIS — O09529 Supervision of elderly multigravida, unspecified trimester: Secondary | ICD-10-CM | POA: Insufficient documentation

## 2024-01-27 DIAGNOSIS — Z3490 Encounter for supervision of normal pregnancy, unspecified, unspecified trimester: Secondary | ICD-10-CM | POA: Insufficient documentation

## 2024-01-30 ENCOUNTER — Ambulatory Visit

## 2024-01-30 ENCOUNTER — Encounter: Payer: Self-pay | Admitting: Obstetrics and Gynecology

## 2024-01-30 ENCOUNTER — Ambulatory Visit: Payer: PRIVATE HEALTH INSURANCE | Admitting: Obstetrics and Gynecology

## 2024-01-30 VITALS — BP 113/75 | HR 86 | Wt 204.0 lb

## 2024-01-30 DIAGNOSIS — O021 Missed abortion: Secondary | ICD-10-CM

## 2024-01-30 DIAGNOSIS — O3680X0 Pregnancy with inconclusive fetal viability, not applicable or unspecified: Secondary | ICD-10-CM

## 2024-01-30 DIAGNOSIS — O3680X1 Pregnancy with inconclusive fetal viability, fetus 1: Secondary | ICD-10-CM

## 2024-01-30 DIAGNOSIS — Z3A10 10 weeks gestation of pregnancy: Secondary | ICD-10-CM | POA: Diagnosis not present

## 2024-01-30 DIAGNOSIS — Z3A01 Less than 8 weeks gestation of pregnancy: Secondary | ICD-10-CM | POA: Diagnosis not present

## 2024-01-30 DIAGNOSIS — Z1339 Encounter for screening examination for other mental health and behavioral disorders: Secondary | ICD-10-CM | POA: Diagnosis not present

## 2024-01-30 DIAGNOSIS — Z3490 Encounter for supervision of normal pregnancy, unspecified, unspecified trimester: Secondary | ICD-10-CM

## 2024-01-30 DIAGNOSIS — Z8759 Personal history of other complications of pregnancy, childbirth and the puerperium: Secondary | ICD-10-CM

## 2024-01-30 DIAGNOSIS — O09521 Supervision of elderly multigravida, first trimester: Secondary | ICD-10-CM

## 2024-01-30 NOTE — Progress Notes (Signed)
 GYNECOLOGY OFFICE VISIT NOTE  History:   Monique Hickman is a 38 y.o. [phone] here today for new OB visit. Limited US done today and I was present throughout it. She is 10 weeks by dates. By ultrasound we saw a gestational sac with separation of the amnion and chorion, and a yolk sac. No definitive fetal pole. Possible submucosal fibroid. She is not having bleeding or pain.   She has a history of an ectopic pregnancy but we reviewed that this is definitively intrauterine.    She denies any abnormal vaginal discharge, bleeding, pelvic pain or other concerns.     Past Medical History:  Diagnosis Date   Ectopic pregnancy 2003   Lapt      Past Surgical History:  Procedure Laterality Date   LAPAROTOMY     Removal of tube    The following portions of the patient's history were reviewed and updated as appropriate: allergies, current medications, past family history, past medical history, past social history, past surgical history and problem list.   Health Maintenance:   Diagnosis  Date Value Ref Range Status  08/11/2021   Final   - Negative for Intraepithelial Lesions or Malignancy (NILM)  08/11/2021 - Benign reactive/reparative changes  Final    Review of Systems:  Pertinent items noted in HPI and remainder of comprehensive ROS otherwise negative.  Physical Exam:  BP 113/75   Pulse 86   Wt 204 lb (92.5 kg)   LMP 11/18/2023   BMI 36.14 kg/m  CONSTITUTIONAL: Well-developed, well-nourished female in no acute distress.  HEENT:  Normocephalic, atraumatic. External right and left ear normal. No scleral icterus.  NECK: Normal range of motion, supple, no masses noted on observation SKIN: No rash noted. Not diaphoretic. No erythema. No pallor. MUSCULOSKELETAL: Normal range of motion. No edema noted. NEUROLOGIC: Alert and oriented to person, place, and time. Normal muscle tone coordination. No cranial nerve deficit noted. PSYCHIATRIC: Normal mood and affect. Normal  behavior. Normal judgment and thought content.  PELVIC: Deferred  Labs and Imaging No results found for this or any previous visit (from the past week). US OB LESS THAN 14 WEEKS WITH OB TRANSVAGINAL Result Date: 01/08/2024 CLINICAL DATA:  First trimester of pregnancy, abdominal pain. EXAM: OBSTETRIC <14 WK [place] AND TRANSVAGINAL OB US TECHNIQUE: Both transabdominal and transvaginal ultrasound examinations were performed for complete evaluation of the gestation as well as the maternal uterus, adnexal regions, and pelvic cul-de-sac. Transvaginal technique was performed to assess early pregnancy. COMPARISON:  Mar 30, 2023. FINDINGS: Intrauterine gestational sac: Single Yolk sac:  Visualized. Embryo:  Visualized. Cardiac Activity: Probable fetal cardiac activity visualized, but heart rate could not be measured due to small size. CRL:  3.4 mm   6 w   0 d                  [place] EDC: September 02, 2024. Subchorionic hemorrhage: Moderate size subchorionic hemorrhage is noted measuring 16.3 x 10.0 x 9.0 cm. Maternal uterus/adnexae: Probable corpus luteum cyst is noted in left ovary. Right ovary is not clearly visualized. Probable uterine fibroid noted in fundus. IMPRESSION: Probable early intrauterine gestational sac with yolk sac and probable fetal pole, as well as possible fetal cardiac activity, although heart rate could not be determined due to small size. Moderate size subchorionic hemorrhage is noted. Recommend follow-up quantitative B-HCG levels and follow-up US in 14 days to assess viability. This recommendation follows SRU consensus guidelines: Diagnostic Criteria for Nonviable Pregnancy Early in the First  Trimester. Malva Limes Med 2013; 962:9528-41. Electronically Signed   By: Lupita Raider M.D.   On: 01/08/2024 08:45    Assessment and Plan:   1. Encounter for supervision of normal pregnancy, antepartum, unspecified gravidity (Primary) - US OB Limited; Future  2. Pregnancy with uncertain fetal viability,  single or unspecified fetus Reviewed based on the presence of a yolk sac and GS on 2/23 and no CRL with + FHT this is consistent with definitive SAB based on Society for Radiologists and ACOG guidelines. Prior US also potentially had CRL which is now no longer apparent. Reviewed by that [place] would expect a very apparent 8-10 wk embryo at this time.  Patient would like to still check another ultrasound. We scheduled it for 3/31 and appt to follow.  - Discussed options: expectant management, misoprostol and D&E. Discussed the risks and benefits to each.   - We discussed dosing and process of miso: Discussed normal bleeding and cramping with the medication. Discussed pain medication often times needed when medically induced for missed abortion. Discussed success rate is depending on gestational age at the time of miscarriage  - Risks of surgery include but are not limited to: bleeding, infection, injury to surrounding organs/tissues (i.e. bowel/bladder/ureters), need for additional procedures, wound complications, hospital re-admission, and conversion to open surgery - We discussed postop restrictions, precautions and expectations. We discussed typical hospital course and stay.  - She is RH positive - US OB LESS THAN 14 WEEKS WITH OB TRANSVAGINAL; Future   No orders of the defined types were placed in this encounter.    Routine preventative health maintenance measures emphasized. Please refer to After Visit Summary for other counseling recommendations.   No follow-ups on file.  Milas Hock, MD, FACOG Obstetrician & Gynecologist, [facility] for [facility], [facility]

## 2024-01-31 ENCOUNTER — Other Ambulatory Visit: Payer: Self-pay | Admitting: Obstetrics and Gynecology

## 2024-01-31 ENCOUNTER — Other Ambulatory Visit: Payer: Self-pay | Admitting: *Deleted

## 2024-01-31 DIAGNOSIS — O021 Missed abortion: Secondary | ICD-10-CM

## 2024-01-31 MED ORDER — ONDANSETRON 4 MG PO TBDP: 4.0000 mg | ORAL_TABLET | Freq: Four times a day (QID) | ORAL | 0 refills | Status: DC | PRN

## 2024-01-31 MED ORDER — MISOPROSTOL 200 MCG PO TABS: ORAL_TABLET | ORAL | 1 refills | Status: DC

## 2024-01-31 MED ORDER — ONDANSETRON 4 MG PO TBDP
4.0000 mg | ORAL_TABLET | Freq: Four times a day (QID) | ORAL | 0 refills | Status: DC | PRN
Start: 2024-01-31 — End: 2024-04-05

## 2024-01-31 MED ORDER — OXYCODONE HCL 5 MG PO TABS
5.0000 mg | ORAL_TABLET | ORAL | 0 refills | Status: DC | PRN
Start: 2024-01-31 — End: 2024-04-05

## 2024-01-31 MED ORDER — OXYCODONE HCL 5 MG PO TABS
5.0000 mg | ORAL_TABLET | ORAL | 0 refills | Status: DC | PRN
Start: 2024-01-31 — End: 2024-01-31

## 2024-01-31 NOTE — Addendum Note (Signed)
 Addended by: Milas Hock A on: 01/31/2024 01:34 PM   Modules accepted: Orders

## 2024-01-31 NOTE — Progress Notes (Signed)
 Pt called back requesting medical management of SAB.

## 2024-02-13 ENCOUNTER — Ambulatory Visit: Admitting: Obstetrics & Gynecology

## 2024-02-13 ENCOUNTER — Ambulatory Visit

## 2024-02-13 ENCOUNTER — Encounter: Payer: Self-pay | Admitting: Obstetrics & Gynecology

## 2024-02-13 VITALS — BP 128/81 | HR 97 | Ht 63.0 in | Wt 202.0 lb

## 2024-02-13 DIAGNOSIS — Z3A00 Weeks of gestation of pregnancy not specified: Secondary | ICD-10-CM | POA: Diagnosis not present

## 2024-02-13 DIAGNOSIS — O3680X0 Pregnancy with inconclusive fetal viability, not applicable or unspecified: Secondary | ICD-10-CM

## 2024-02-13 DIAGNOSIS — O039 Complete or unspecified spontaneous abortion without complication: Secondary | ICD-10-CM | POA: Diagnosis not present

## 2024-02-13 DIAGNOSIS — Z3A01 Less than 8 weeks gestation of pregnancy: Secondary | ICD-10-CM | POA: Diagnosis not present

## 2024-02-13 DIAGNOSIS — Z3009 Encounter for other general counseling and advice on contraception: Secondary | ICD-10-CM | POA: Diagnosis not present

## 2024-02-13 NOTE — Progress Notes (Unsigned)
 Pt is currently spotting but does not have any pain

## 2024-02-13 NOTE — Progress Notes (Unsigned)
   Subjective:    Patient ID: Monique Hickman, female    DOB: 01-13-1986, 38 y.o.   MRN: [id number]  HPI  38 yo female presents after cytotec for SAB.  Pt passed POC at home.  Nausea and pain better.  Still having some spotting. Pt does not want to conceive again for awhile.  Wants IUD replaced.  Had sex 1 weeks ago.    Review of Systems  Constitutional: Negative.   Respiratory: Negative.    Cardiovascular: Negative.   Gastrointestinal: Negative.   Genitourinary: Negative.    F/u US today  COMPARISON:  01/08/2024   FINDINGS: Intrauterine gestational sac: None. Previously seen intrauterine gestation is no longer visualized, consistent with interval SAB.   Maternal uterus/adnexae: Heterogenous hypoechoic material in endometrial cavity measuring 13 mm in thickness, without internal blood flow on color Doppler ultrasound, likely representing blood products. Small central submucosal fibroid seen measuring 1.5 cm. Both ovaries are normal in appearance. No adnexal mass or abnormal free fluid identified.   IMPRESSION: Previously seen intrauterine gestational sac no longer visualized, consistent with interval spontaneous abortion.   Probable blood products in endometrial cavity. 1.5 cm submucosal fibroid     Electronically Signed   By: Danae Orleans M.D.   On: 02/13/2024 11:58      Objective:   Physical Exam Vitals reviewed.  Constitutional:      General: She is not in acute distress.    Appearance: She is well-developed.  HENT:     Head: Normocephalic and atraumatic.  Eyes:     Conjunctiva/sclera: Conjunctivae normal.  Cardiovascular:     Rate and Rhythm: Normal rate.  Pulmonary:     Effort: Pulmonary effort is normal.  Skin:    General: Skin is warm and dry.  Neurological:     Mental Status: She is alert and oriented to person, place, and time.  Psychiatric:        Mood and Affect: Mood normal.    Vitals:   02/13/24 1349  BP: 128/81  Pulse: 97   Weight: 202 lb (91.6 kg)  Height: 5\' 3"  (1.6 m)      Assessment & Plan:   38 yo female s/p SAB   Beta Hcg 1 day prior to IUD insertion.  [place] looks like blood products and not retained POC. IUD insertion in 1 week Abstain from intercourse or use condoms-pt chooses abstain.  I provided 30 minutes of verbal and non-verbal time during this encounter date, time was needed to gather information, review chart, records, communicate/coordinate with staff, as well as complete documentation.

## 2024-02-21 ENCOUNTER — Ambulatory Visit

## 2024-02-21 DIAGNOSIS — O039 Complete or unspecified spontaneous abortion without complication: Secondary | ICD-10-CM

## 2024-02-21 NOTE — Progress Notes (Signed)
 Labs ordered as requested by provide.

## 2024-02-22 ENCOUNTER — Encounter: Payer: Self-pay | Admitting: Obstetrics and Gynecology

## 2024-02-22 ENCOUNTER — Ambulatory Visit: Admitting: Obstetrics and Gynecology

## 2024-02-22 VITALS — BP 114/71 | HR 82 | Ht 63.0 in | Wt 204.0 lb

## 2024-02-22 DIAGNOSIS — Z3009 Encounter for other general counseling and advice on contraception: Secondary | ICD-10-CM

## 2024-02-22 DIAGNOSIS — Z3201 Encounter for pregnancy test, result positive: Secondary | ICD-10-CM | POA: Diagnosis not present

## 2024-02-22 LAB — BETA HCG QUANT (REF LAB): hCG Quant: 263 m[IU]/mL

## 2024-02-22 LAB — POCT URINE PREGNANCY: Preg Test, Ur: POSITIVE — AB

## 2024-02-22 NOTE — Progress Notes (Signed)
 GYNECOLOGY OFFICE VISIT NOTE  History:   Monique Hickman is a 38 y.o. [phone] here today for possible IUD insertion.   She had recent SAB and did the medication for it on 3/18. HCG around that time was 92,000.  She had unprotected intercourse on 3/24. She had F/u visit with Dr. Penne Lash and [place]. US showed no evidence of RPOC.  She does have a submucosal fibroid about 1.5 cm.   Since the SAB, she has had spotting but no episodes of heavier bleeding. Denies any cramping.   She has abstained for intercourse since 3/24. She does not wish to have a pregnancy soon and would like an IUD. She has done other birth control in the past but had side effects.    The following portions of the patient's history were reviewed and updated as appropriate: allergies, current medications, past family history, past medical history, past social history, past surgical history and problem list.   Health Maintenance:   Diagnosis  Date Value Ref Range Status  08/11/2021   Final   - Negative for Intraepithelial Lesions or Malignancy (NILM)  08/11/2021 - Benign reactive/reparative changes  Final    Review of Systems:  Pertinent items noted in HPI and remainder of comprehensive ROS otherwise negative.  Physical Exam:  BP 114/71   Pulse 82   Ht 5\' 3"  (1.6 m)   Wt 204 lb (92.5 kg)   LMP 11/16/2023 (Approximate)   BMI 36.14 kg/m  CONSTITUTIONAL: Well-developed, well-nourished female in no acute distress.  HEENT:  Normocephalic, atraumatic. External right and left ear normal. No scleral icterus.  NECK: Normal range of motion, supple, no masses noted on observation SKIN: No rash noted. Not diaphoretic. No erythema. No pallor. MUSCULOSKELETAL: Normal range of motion. No edema noted. NEUROLOGIC: Alert and oriented to person, place, and time. Normal muscle tone coordination. No cranial nerve deficit noted. PSYCHIATRIC: Normal mood and affect. Normal behavior. Normal judgment and thought  content.  PELVIC: Deferred  Labs and Imaging Results for orders placed or performed in visit on 02/22/24 (from the past week)  POCT urine pregnancy   Collection Time: 02/22/24 10:26 AM  Result Value Ref Range   Preg Test, Ur Positive (A) Negative  Results for orders placed or performed in visit on 02/21/24 (from the past week)  Beta hCG quant (ref lab)   Collection Time: 02/21/24  9:11 AM  Result Value Ref Range   hCG Quant 263 mIU/mL   US OB LESS THAN 14 WEEKS WITH OB TRANSVAGINAL Result Date: 02/13/2024 CLINICAL DATA:  Abdominal pain.  Failed IUP EXAM: OBSTETRIC <14 WK [place] AND TRANSVAGINAL OB US TECHNIQUE: Transvaginal ultrasound was performed for complete evaluation of the gestation as well as the maternal uterus, adnexal regions, and pelvic cul-de-sac. COMPARISON:  01/08/2024 FINDINGS: Intrauterine gestational sac: None. Previously seen intrauterine gestation is no longer visualized, consistent with interval SAB. Maternal uterus/adnexae: Heterogenous hypoechoic material in endometrial cavity measuring 13 mm in thickness, without internal blood flow on color Doppler ultrasound, likely representing blood products. Small central submucosal fibroid seen measuring 1.5 cm. Both ovaries are normal in appearance. No adnexal mass or abnormal free fluid identified. IMPRESSION: Previously seen intrauterine gestational sac no longer visualized, consistent with interval spontaneous abortion. Probable blood products in endometrial cavity. 1.5 cm submucosal fibroid Electronically Signed   By: Danae Orleans M.D.   On: 02/13/2024 11:58   US OB Limited Result Date: 02/02/2024 ----------------------------------------------------------------------  OBSTETRICS REPORT                       (  Signed Final 02/02/2024 12:47 pm) ---------------------------------------------------------------------- Patient Info  ID #:       [id number]                          D.O.B.:  [birth year] (38 yrs)(F)  Name:       Monique Hickman                Visit Date: 01/30/2024 11:02 am              [name] ---------------------------------------------------------------------- Performed By  Attending:        Milas Hock MD        Ref. Address:     [address], [place]  Performed By:     Mariel Aloe RN          Location:         [facility]                                                             [facility]  Referred By:      Everardo All ---------------------------------------------------------------------- Orders  #  Description                           Code        Ordered By  1  US OB LIMITED                         27253.6     Milas Hock ----------------------------------------------------------------------  #  Order #                     Accession #                Episode #  1  [id number]                   [id number]                 [id number] ---------------------------------------------------------------------- Indications  Weeks of gestation of pregnancy not            Z3A.00  specified ---------------------------------------------------------------------- Fetal Evaluation  Num Of Fetuses:         1  Preg. Location:         Intrauterine  Gest. Sac:              Intrauterine  Yolk Sac:               Visualized  Cardiac Activity:       Not visualized ---------------------------------------------------------------------- Biometry  GS:       28.8  mm     G. Age:  8w 4d                   EDD:   09/06/24 ---------------------------------------------------------------------- Comments  Previously confirmed IUP on 2/23 with yolk sac and possible  CRL of 3mm. Today's ultrasound shows yolk sac and  gestational sac, but no IUP consistent with definitive criteria  of failed pregnancy. Possible submucosal fibroid measuring  1.93 cm noted.  ---------------------------------------------------------------------- Recommendations  Management of miscarriage as clinically indicated. ----------------------------------------------------------------------                 Milas Hock, MD Electronically Signed Final Report   02/02/2024 12:47 pm ----------------------------------------------------------------------    Assessment and Plan:   1. Counseling for birth control regarding intrauterine device (IUD) (Primary) Discussed options for birth control until IUD may be placed. She would like to abstain or withdrawal method until HCG is 0. Encouraged best is no unprotected intercourse.  - POCT urine pregnancy - Beta hCG quant (ref lab); Future  2. Positive pregnancy test Recheck HCG in one week. If rising or plateau would want to do [place] and/or D&E for possible RPOC vs molar which she and I discussed. Based on the above history, I do not think it is possible for her to be pregnant.  - Beta hCG quant (ref lab); Future   No orders of the defined types were placed in this encounter.    Routine preventative health maintenance measures emphasized. Please refer to After Visit Summary for other counseling recommendations.   Return for HCG level in one week, then plan from there. Milas Hock, MD, FACOG Obstetrician & Gynecologist, [facility] for [facility], [facility]

## 2024-02-29 ENCOUNTER — Ambulatory Visit: Payer: Self-pay

## 2024-03-16 LAB — BETA HCG QUANT (REF LAB): hCG Quant: 12 m[IU]/mL

## 2024-03-20 ENCOUNTER — Encounter: Payer: Self-pay | Admitting: Obstetrics and Gynecology

## 2024-03-21 ENCOUNTER — Ambulatory Visit

## 2024-03-21 VITALS — BP 123/75 | HR 77 | Wt 206.0 lb

## 2024-03-21 DIAGNOSIS — O039 Complete or unspecified spontaneous abortion without complication: Secondary | ICD-10-CM

## 2024-03-22 LAB — BETA HCG QUANT (REF LAB): hCG Quant: 8 m[IU]/mL

## 2024-03-26 ENCOUNTER — Encounter: Payer: Self-pay | Admitting: Obstetrics and Gynecology

## 2024-03-27 ENCOUNTER — Ambulatory Visit

## 2024-03-27 DIAGNOSIS — O021 Missed abortion: Secondary | ICD-10-CM

## 2024-03-27 DIAGNOSIS — Z3A01 Less than 8 weeks gestation of pregnancy: Secondary | ICD-10-CM

## 2024-03-27 NOTE — Progress Notes (Signed)
 Pt here to pick up lab for repeat quant. Sent to lab. Monique Hickman

## 2024-03-27 NOTE — Progress Notes (Signed)
 Patient presents for HCG. Patient was sent to the lab to have labs drawn. Monique Hickman l [name], CMA

## 2024-03-28 LAB — BETA HCG QUANT (REF LAB): hCG Quant: 6 m[IU]/mL

## 2024-03-30 ENCOUNTER — Ambulatory Visit: Payer: Self-pay | Admitting: Obstetrics and Gynecology

## 2024-04-05 ENCOUNTER — Other Ambulatory Visit (HOSPITAL_COMMUNITY)
Admission: RE | Admit: 2024-04-05 | Discharge: 2024-04-05 | Disposition: A | Discharge status: Home / Self Care | Source: Ambulatory Visit | Attending: Obstetrics and Gynecology | Admitting: Obstetrics and Gynecology

## 2024-04-05 ENCOUNTER — Ambulatory Visit: Admitting: Obstetrics and Gynecology

## 2024-04-05 ENCOUNTER — Encounter: Payer: Self-pay | Admitting: Obstetrics and Gynecology

## 2024-04-05 VITALS — BP 113/74 | HR 88 | Ht 64.0 in | Wt 212.0 lb

## 2024-04-05 DIAGNOSIS — Z3202 Encounter for pregnancy test, result negative: Secondary | ICD-10-CM

## 2024-04-05 DIAGNOSIS — N898 Other specified noninflammatory disorders of vagina: Secondary | ICD-10-CM | POA: Diagnosis present

## 2024-04-05 DIAGNOSIS — Z30430 Encounter for insertion of intrauterine contraceptive device: Secondary | ICD-10-CM | POA: Diagnosis not present

## 2024-04-05 LAB — POCT URINE PREGNANCY: Preg Test, Ur: NEGATIVE

## 2024-04-05 MED ORDER — LEVONORGESTREL 20 MCG/DAY IU IUD
1.0000 | INTRAUTERINE_SYSTEM | Freq: Once | INTRAUTERINE | Status: AC
  Administered 2024-04-05: 1 via INTRAUTERINE

## 2024-04-05 NOTE — Progress Notes (Signed)
    GYNECOLOGY OFFICE PROCEDURE NOTE  Monique Hickman is a 38 y.o. [phone] here for IUD insertion. No GYN concerns.  Last pap smear was on 08/11/21 NILM/HPV neg  IUD Insertion Procedure Note Procedure: IUD insertion with Mirena  UPT: Negative GC/CT testing: Offered and declines  Patient identified.  Risks, benefits and alternatives of procedure were discussed including irregular bleeding, cramping, infection, malpositioning or misplacement of the IUD outside the uterus which may require further procedure such as laparoscopy. Also discussed >99% contraception efficacy, increased risk of ectopic pregnancy with failure of method.   Emphasized that this did not protect against STIs, condoms recommended during all sexual encounters. Consent signed. Time out performed.   Speculum inserted and cervix visualized, prepped with 3 swabs of betadine. 2cc of lidocaine  1% injected intracervically at planned tenaculum site. Grasped with a single tooth tenaculum. , Uterus sounded to 9 cm. Os finder used. IUD then inserted without difficulty per manufacturer's instructions and strings cut to 3 cm below cervical os and all instruments removed. Pt tolerated well with minimal pain and bleeding.   Discussed concerning signs/symptoms and to call if heavy bleeding, severe abdominal pain, or fever in the following 3 weeks. Manufacturer pamphlet/patient information given. Reviewed timing of efficacy for contraception and to use an alternative form of birth control until that time.  Loralyn Rochester, MD Obstetrician & Gynecologist, [facility] for [employer], [facility]

## 2024-04-06 ENCOUNTER — Ambulatory Visit: Payer: Self-pay | Admitting: Obstetrics and Gynecology

## 2024-04-06 LAB — CERVICOVAGINAL ANCILLARY ONLY
Bacterial Vaginitis (gardnerella): NEGATIVE
Candida Glabrata: NEGATIVE
Candida Vaginitis: POSITIVE — AB
Comment: NEGATIVE
Comment: NEGATIVE
Comment: NEGATIVE

## 2024-04-06 MED ORDER — FLUCONAZOLE 150 MG PO TABS: 150.0000 mg | ORAL_TABLET | Freq: Once | ORAL | 0 refills | Status: AC

## 2024-08-02 ENCOUNTER — Other Ambulatory Visit (HOSPITAL_COMMUNITY)
Admission: RE | Admit: 2024-08-02 | Discharge: 2024-08-02 | Disposition: A | Discharge status: Home / Self Care | Source: Ambulatory Visit | Attending: Obstetrics and Gynecology | Admitting: Obstetrics and Gynecology

## 2024-08-02 ENCOUNTER — Other Ambulatory Visit: Payer: Self-pay | Admitting: Obstetrics and Gynecology

## 2024-08-02 DIAGNOSIS — Z01419 Encounter for gynecological examination (general) (routine) without abnormal findings: Secondary | ICD-10-CM | POA: Diagnosis present

## 2024-08-06 LAB — CYTOLOGY - PAP
Comment: NEGATIVE
Diagnosis: NEGATIVE
High risk HPV: NEGATIVE
